# Patient Record
Sex: Female | Born: 1980 | Race: Black or African American | Hispanic: No | Marital: Single | Smoking: Never smoker
Health system: Southern US, Community
[De-identification: ages and names within clinical notes are randomized; demographics above are authoritative.]

## PROBLEM LIST (undated history)

## (undated) ENCOUNTER — Emergency Department (HOSPITAL_BASED_OUTPATIENT_CLINIC_OR_DEPARTMENT_OTHER): Payer: No Typology Code available for payment source

## (undated) ENCOUNTER — Inpatient Hospital Stay (HOSPITAL_COMMUNITY): Payer: Self-pay

## (undated) DIAGNOSIS — N390 Urinary tract infection, site not specified: Secondary | ICD-10-CM

## (undated) DIAGNOSIS — A749 Chlamydial infection, unspecified: Secondary | ICD-10-CM

## (undated) HISTORY — PX: NO PAST SURGERIES: SHX2092

---
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
## 2000-10-17 ENCOUNTER — Encounter: Payer: Self-pay | Admitting: Family Medicine

## 2000-10-17 ENCOUNTER — Ambulatory Visit (HOSPITAL_COMMUNITY): Admission: RE | Admit: 2000-10-17 | Discharge: 2000-10-17 | Payer: Self-pay | Admitting: Family Medicine

## 2001-03-18 HISTORY — PX: OTHER SURGICAL HISTORY: SHX169

## 2001-04-26 ENCOUNTER — Emergency Department (HOSPITAL_COMMUNITY): Admission: EM | Admit: 2001-04-26 | Discharge: 2001-04-26 | Payer: Self-pay | Admitting: Emergency Medicine

## 2001-05-04 ENCOUNTER — Encounter: Admission: RE | Admit: 2001-05-04 | Discharge: 2001-05-04 | Payer: Self-pay | Admitting: Family Medicine

## 2001-05-04 ENCOUNTER — Encounter: Payer: Self-pay | Admitting: Family Medicine

## 2003-09-06 ENCOUNTER — Emergency Department (HOSPITAL_COMMUNITY): Admission: EM | Admit: 2003-09-06 | Discharge: 2003-09-06 | Payer: Self-pay | Admitting: Emergency Medicine

## 2003-11-29 ENCOUNTER — Emergency Department (HOSPITAL_COMMUNITY): Admission: EM | Admit: 2003-11-29 | Discharge: 2003-11-29 | Payer: Self-pay | Admitting: Emergency Medicine

## 2004-06-12 ENCOUNTER — Emergency Department (HOSPITAL_COMMUNITY): Admission: EM | Admit: 2004-06-12 | Discharge: 2004-06-12 | Payer: Self-pay | Admitting: Emergency Medicine

## 2004-06-19 ENCOUNTER — Emergency Department (HOSPITAL_COMMUNITY): Admission: EM | Admit: 2004-06-19 | Discharge: 2004-06-19 | Payer: Self-pay | Admitting: Family Medicine

## 2004-08-17 ENCOUNTER — Ambulatory Visit: Payer: Self-pay | Admitting: Family Medicine

## 2004-08-20 ENCOUNTER — Ambulatory Visit: Payer: Self-pay | Admitting: *Deleted

## 2006-10-03 ENCOUNTER — Emergency Department (HOSPITAL_COMMUNITY): Admission: EM | Admit: 2006-10-03 | Discharge: 2006-10-04 | Payer: Self-pay | Admitting: Emergency Medicine

## 2007-07-13 ENCOUNTER — Emergency Department (HOSPITAL_COMMUNITY): Admission: EM | Admit: 2007-07-13 | Discharge: 2007-07-13 | Payer: Self-pay | Admitting: Family Medicine

## 2007-08-18 ENCOUNTER — Ambulatory Visit: Payer: Self-pay | Admitting: Nurse Practitioner

## 2007-08-18 DIAGNOSIS — B977 Papillomavirus as the cause of diseases classified elsewhere: Secondary | ICD-10-CM

## 2007-08-18 DIAGNOSIS — R3129 Other microscopic hematuria: Secondary | ICD-10-CM

## 2007-08-19 ENCOUNTER — Encounter (INDEPENDENT_AMBULATORY_CARE_PROVIDER_SITE_OTHER): Payer: Self-pay | Admitting: Nurse Practitioner

## 2007-08-21 ENCOUNTER — Encounter (INDEPENDENT_AMBULATORY_CARE_PROVIDER_SITE_OTHER): Payer: Self-pay | Admitting: Nurse Practitioner

## 2007-09-15 ENCOUNTER — Ambulatory Visit: Payer: Self-pay | Admitting: Internal Medicine

## 2007-09-15 LAB — CONVERTED CEMR LAB
Bilirubin Urine: NEGATIVE
Blood in Urine, dipstick: NEGATIVE
Nitrite: NEGATIVE
Protein, U semiquant: NEGATIVE
Specific Gravity, Urine: <1.005
Urobilinogen, UA: 0.2
WBC Urine, dipstick: NEGATIVE

## 2007-09-27 ENCOUNTER — Encounter (INDEPENDENT_AMBULATORY_CARE_PROVIDER_SITE_OTHER): Payer: Self-pay | Admitting: Nurse Practitioner

## 2007-09-27 ENCOUNTER — Ambulatory Visit: Payer: Self-pay | Admitting: Family Medicine

## 2007-09-27 DIAGNOSIS — R8789 Other abnormal findings in specimens from female genital organs: Secondary | ICD-10-CM | POA: Insufficient documentation

## 2007-09-27 LAB — CONVERTED CEMR LAB
Bilirubin Urine: NEGATIVE
Glucose, Urine, Semiquant: NEGATIVE
Protein, U semiquant: NEGATIVE
Specific Gravity, Urine: <1.005
pH: 7

## 2007-09-28 ENCOUNTER — Encounter (INDEPENDENT_AMBULATORY_CARE_PROVIDER_SITE_OTHER): Payer: Self-pay | Admitting: Nurse Practitioner

## 2007-10-02 ENCOUNTER — Telehealth (INDEPENDENT_AMBULATORY_CARE_PROVIDER_SITE_OTHER): Payer: Self-pay | Admitting: *Deleted

## 2007-10-20 ENCOUNTER — Ambulatory Visit: Payer: Self-pay | Admitting: Obstetrics & Gynecology

## 2007-10-20 ENCOUNTER — Other Ambulatory Visit: Admission: RE | Admit: 2007-10-20 | Discharge: 2007-10-20 | Payer: Self-pay | Admitting: Obstetrics & Gynecology

## 2007-10-20 ENCOUNTER — Encounter (INDEPENDENT_AMBULATORY_CARE_PROVIDER_SITE_OTHER): Payer: Self-pay | Admitting: Nurse Practitioner

## 2007-11-02 ENCOUNTER — Ambulatory Visit: Payer: Self-pay | Admitting: Obstetrics & Gynecology

## 2007-11-13 ENCOUNTER — Telehealth (INDEPENDENT_AMBULATORY_CARE_PROVIDER_SITE_OTHER): Payer: Self-pay | Admitting: Nurse Practitioner

## 2007-11-16 ENCOUNTER — Ambulatory Visit: Payer: Self-pay | Admitting: Nurse Practitioner

## 2007-11-16 DIAGNOSIS — N760 Acute vaginitis: Secondary | ICD-10-CM | POA: Insufficient documentation

## 2007-11-16 DIAGNOSIS — N898 Other specified noninflammatory disorders of vagina: Secondary | ICD-10-CM | POA: Insufficient documentation

## 2007-11-16 LAB — CONVERTED CEMR LAB
Glucose, Urine, Semiquant: NEGATIVE
Protein, U semiquant: NEGATIVE
WBC Urine, dipstick: NEGATIVE

## 2007-11-17 ENCOUNTER — Encounter (INDEPENDENT_AMBULATORY_CARE_PROVIDER_SITE_OTHER): Payer: Self-pay | Admitting: Nurse Practitioner

## 2007-11-17 LAB — CONVERTED CEMR LAB: GC Probe Amp, Urine: NEGATIVE

## 2007-12-29 ENCOUNTER — Telehealth (INDEPENDENT_AMBULATORY_CARE_PROVIDER_SITE_OTHER): Payer: Self-pay | Admitting: Nurse Practitioner

## 2008-01-03 ENCOUNTER — Ambulatory Visit: Payer: Self-pay | Admitting: Nurse Practitioner

## 2008-01-03 DIAGNOSIS — B373 Candidiasis of vulva and vagina: Secondary | ICD-10-CM | POA: Insufficient documentation

## 2008-01-03 LAB — CONVERTED CEMR LAB
Bilirubin Urine: NEGATIVE
Glucose, Urine, Semiquant: NEGATIVE
KOH Prep: NEGATIVE
Ketones, urine, test strip: NEGATIVE
Nitrite: NEGATIVE
Specific Gravity, Urine: 1.025
Urobilinogen, UA: 0.2
WBC Urine, dipstick: NEGATIVE

## 2008-01-04 ENCOUNTER — Encounter (INDEPENDENT_AMBULATORY_CARE_PROVIDER_SITE_OTHER): Payer: Self-pay | Admitting: Nurse Practitioner

## 2008-04-26 ENCOUNTER — Ambulatory Visit: Payer: Self-pay | Admitting: Nurse Practitioner

## 2008-04-26 ENCOUNTER — Encounter (INDEPENDENT_AMBULATORY_CARE_PROVIDER_SITE_OTHER): Payer: Self-pay | Admitting: Nurse Practitioner

## 2008-04-26 LAB — CONVERTED CEMR LAB
Bilirubin Urine: NEGATIVE
Protein, U semiquant: NEGATIVE
Urobilinogen, UA: 0.2
pH: 6

## 2008-05-03 ENCOUNTER — Encounter (INDEPENDENT_AMBULATORY_CARE_PROVIDER_SITE_OTHER): Payer: Self-pay | Admitting: Nurse Practitioner

## 2009-05-08 ENCOUNTER — Encounter (INDEPENDENT_AMBULATORY_CARE_PROVIDER_SITE_OTHER): Payer: Self-pay | Admitting: Nurse Practitioner

## 2009-05-26 ENCOUNTER — Ambulatory Visit: Payer: Self-pay | Admitting: Nurse Practitioner

## 2009-05-26 DIAGNOSIS — K5900 Constipation, unspecified: Secondary | ICD-10-CM | POA: Insufficient documentation

## 2009-05-26 LAB — CONVERTED CEMR LAB
ALT: <8 units/L (ref 0–35)
Albumin: 4.6 g/dL (ref 3.5–5.2)
CO2: 21 meq/L (ref 19–32)
Chloride: 106 meq/L (ref 96–112)
Glucose, Bld: 58 mg/dL — ABNORMAL LOW (ref 70–99)
HCT: 41.4 % (ref 36.0–46.0)
Hemoglobin: 13.2 g/dL (ref 12.0–15.0)
Lymphocytes Relative: 36 % (ref 12–46)
MCHC: 31.9 g/dL (ref 30.0–36.0)
MCV: 96.1 fL (ref 78.0–100.0)
Monocytes Relative: 5 % (ref 3–12)
Neutro Abs: 3.8 10*3/uL (ref 1.7–7.7)
Neutrophils Relative %: 57 % (ref 43–77)
Platelets: 239 10*3/uL (ref 150–400)
Potassium: 4 meq/L (ref 3.5–5.3)
Protein, U semiquant: NEGATIVE
RBC: 4.31 M/uL (ref 3.87–5.11)
RDW: 12.9 % (ref 11.5–15.5)
Sodium: 139 meq/L (ref 135–145)
Specific Gravity, Urine: 1.01
TSH: 2.18 microintl units/mL (ref 0.350–4.500)
Urobilinogen, UA: 0.2
WBC Urine, dipstick: NEGATIVE
pH: 6.5

## 2009-06-02 ENCOUNTER — Encounter (INDEPENDENT_AMBULATORY_CARE_PROVIDER_SITE_OTHER): Payer: Self-pay | Admitting: Nurse Practitioner

## 2009-06-02 LAB — CONVERTED CEMR LAB: Pap Smear: NEGATIVE

## 2009-06-13 ENCOUNTER — Telehealth (INDEPENDENT_AMBULATORY_CARE_PROVIDER_SITE_OTHER): Payer: Self-pay | Admitting: Nurse Practitioner

## 2009-06-19 ENCOUNTER — Encounter (INDEPENDENT_AMBULATORY_CARE_PROVIDER_SITE_OTHER): Payer: Self-pay | Admitting: *Deleted

## 2009-07-07 ENCOUNTER — Ambulatory Visit: Payer: Self-pay | Admitting: Nurse Practitioner

## 2009-07-07 ENCOUNTER — Inpatient Hospital Stay (HOSPITAL_COMMUNITY): Admission: AD | Admit: 2009-07-07 | Discharge: 2009-07-07 | Payer: Self-pay | Admitting: Obstetrics & Gynecology

## 2009-11-06 ENCOUNTER — Ambulatory Visit: Payer: Self-pay | Admitting: Nurse Practitioner

## 2009-11-06 LAB — CONVERTED CEMR LAB
Blood in Urine, dipstick: NEGATIVE
Ketones, urine, test strip: NEGATIVE
Protein, U semiquant: NEGATIVE
Specific Gravity, Urine: <1.005
Urobilinogen, UA: 0.2
WBC Urine, dipstick: NEGATIVE
pH: 6

## 2010-02-15 LAB — CONVERTED CEMR LAB
Albumin: 4.9 g/dL (ref 3.5–5.2)
Alkaline Phosphatase: 73 units/L (ref 39–117)
Basophils Relative: 0 % (ref 0–1)
Calcium: 10.1 mg/dL (ref 8.4–10.5)
Chloride: 102 meq/L (ref 96–112)
Cholesterol: 189 mg/dL (ref 0–200)
Eosinophils Absolute: 0.1 10*3/uL (ref 0.0–0.7)
Eosinophils Relative: 1 % (ref 0–5)
Glucose, Bld: 69 mg/dL — ABNORMAL LOW (ref 70–99)
HCT: 43.8 % (ref 36.0–46.0)
HDL: 75 mg/dL (ref >39)
Hemoglobin: 14.1 g/dL (ref 12.0–15.0)
Ketones, urine, test strip: NEGATIVE
Lymphs Abs: 2.1 10*3/uL (ref 0.7–4.0)
MCV: 94 fL (ref 78.0–100.0)
Monocytes Absolute: 0.4 10*3/uL (ref 0.1–1.0)
Monocytes Relative: 6 % (ref 3–12)
Neutrophils Relative %: 61 % (ref 43–77)
Protein, U semiquant: NEGATIVE
Sodium: 138 meq/L (ref 135–145)
Specific Gravity, Urine: <1.005
TSH: 1.057 microintl units/mL (ref 0.350–4.50)
Total CHOL/HDL Ratio: 2.5
Triglycerides: 57 mg/dL (ref <150)
Urobilinogen, UA: 0.2
VLDL: 11 mg/dL (ref 0–40)
WBC: 6.5 10*3/uL (ref 4.0–10.5)
pH: 5.5

## 2010-02-19 NOTE — Progress Notes (Signed)
Summary: Office Visit//DEPRESSION SCREENING  Office Visit//DEPRESSION SCREENING   Imported By: Arta Bruce 07/22/2009 15:25:11  _____________________________________________________________________  External Attachment:    Type:   Image     Comment:   External Document

## 2010-02-19 NOTE — Progress Notes (Signed)
Summary: BURNING AND DISCHARGE  Phone Note Call from Patient Call back at Home Phone [phone]   Summary of Call: Jaime Frost CALLED TO GET HER PAP AND LAB RESULTS. I TOLD HER THAT THE LETTER STATES THAT HER PAP AND LABS WERE OK.  SHE SAYS THAT SHE HAS SOME BURNING AND DISHCARGE. THE BURNING HAS BEEN GOING ON FOR A COUPLE OF WEEKS AND THE DISCHARGE JUST STARTED AND IT HAS AN ODOR AND THE SMELL IS AWFUL, AND W/THE DISCHARGE COMING OUT, IT FEELS LIKE HER CYCLE IS OFF. Initial call taken by: Leodis Rains,  Jun 13, 2009 12:29 PM  Follow-up for Phone Call        forward to N. Daphine Deutscher, FNP Follow-up by: Levon Hedger,  Jun 13, 2009 12:55 PM  Additional Follow-up for Phone Call Additional follow up Details #1::        pt can come for u/a and wet prep on next week however office is closed on monday. over the weekend advise pt to wipe from front to back, wear white cotton underwear, eat yogurt, drink water, no body gels or wash Additional Follow-up by: Lehman Prom FNP,  Jun 13, 2009 2:55 PM    Additional Follow-up for Phone Call Additional follow up Details #2::    called [phone] mail box is not set up could not leave a message. Levon Hedger  Jun 17, 2009 4:00 PM  Mailbox not set up -- unable to leave message.  Letter sent.  Dutch Quint RN  June 19, 2009 3:01 PM   Additional Follow-up for Phone Call Additional follow up Details #3:: Details for Additional Follow-up Action Taken: noted  Additional Follow-up by: Lehman Prom FNP,  June 19, 2009 3:07 PM

## 2010-02-19 NOTE — Letter (Signed)
Summary: *HSN Results Follow up  [facility]  [address] [place], [place] [address]   Phone: [phone]  Fax: [phone]      06/02/2009   ANALISE GLOTFELTY [address] [place], [place]  [address]   Dear  Ms. Deette Greeno,                            ____S.Drinkard,FNP   ____D. Gore,FNP       ____B. McPherson,MD   ____V. Rankins,MD    ____E. Mulberry,MD    _X___N. Daphine Deutscher, FNP  ____D. Reche Dixon, MD    ____K. Philipp Deputy, MD    ____Other     This letter is to inform you that your recent test(s):  ___X____Pap Smear    ___X____Lab Test     _______X-ray    ___X____ is within acceptable limits  _______ requires a medication change  _______ requires a follow-up lab visit  _______ requires a follow-up visit with your provider   Comments: Labs and pap smear done during recent office visit are normal.      _________________________________________________________ If you have any questions, please contact our office [phone].                    Sincerely,    Lehman Prom FNP [facility]

## 2010-02-19 NOTE — Letter (Signed)
Summary: Handout Printed  Printed Handout:  - Constipation in Adults

## 2010-02-19 NOTE — Assessment & Plan Note (Signed)
Summary: Reschedule  Pt is currently on Menses.  She was advised to reschedule CPE   Allergies: No Known Drug Allergies

## 2010-02-19 NOTE — Assessment & Plan Note (Signed)
Summary: Bacterial Vaginosis   Vital Signs:  Patient profile:   30 year old female Menstrual status:  regular LMP:     10/15/2009 Weight:      120.0 pounds BMI:     20.83 Temp:     97.9 degrees F oral Pulse rate:   70 / minute Pulse rhythm:   regular Resp:     12 per minute BP sitting:   110 / 60  (left arm) Cuff size:   regular  Vitals Entered By: Levon Hedger (November 06, 2009 12:25 PM) CC: vaginal discharge, Vaginal discharge Is Patient Diabetic? No Pain Assessment Patient in pain? no       Does patient need assistance? Functional Status Self care Ambulation Normal LMP (date): 10/15/2009 LMP - Character: light-heavy    Menses interval (days): 28 Enter LMP: 10/15/2009 Last PAP Result  Specimen Adequacy: Satisfactory for evaluation.   Interpretation/Result:Negative for intraepithelial Lesion or Malignancy.  Benign repairative changes present   CC:  vaginal discharge and Vaginal discharge.  History of Present Illness:  Vaginal discharge      This is a 30 year old woman who presents with Vaginal discharge.  The symptoms began 1 week ago.  The severity is described as moderate.  The patient complains of itching, but denies burning on urination, frequency, and urgency.  The discharge is described as white.  The patient denies the following symptoms: genital sores, unusual vaginal bleeding, and rash.    Allergies (verified): No Known Drug Allergies  Review of Systems CV:  Denies chest pain or discomfort. Resp:  Denies cough. GI:  Denies abdominal pain, nausea, and vomiting. GU:  +vaginal itching.  Physical Exam  General:  alert.   Head:  normocephalic.   Genitalia:  self wet prep Msk:  normal ROM.   Neurologic:  alert & oriented X3.     Impression & Recommendations:  Problem # 1:  BACTERIAL VAGINITIS (ICD-616.10)  Her updated medication list for this problem includes:    Metronidazole 500 Mg Tabs (Metronidazole) ..... One tablet by mouth two times a  day for infection  Orders: KOH/ WET Mount [phone]) UA Dipstick w/o Micro (manual) ([address])  Problem # 2:  NEED PROPHYLACTIC VACCINATION&INOCULATION FLU (ICD-V04.81) given today  Complete Medication List: 1)  Metronidazole 500 Mg Tabs (Metronidazole) .... One tablet by mouth two times a day for infection 2)  Fluconazole 150 Mg Tabs (Fluconazole) .... One tablet by mouth x 1 dose  Patient Instructions: 1)  You have received the flu vaccine today Prescriptions: FLUCONAZOLE 150 MG TABS (FLUCONAZOLE) One tablet by mouth x 1 dose  #1 x 0   Entered and Authorized by:   Lehman Prom FNP   Signed by:   Lehman Prom FNP on 11/06/2009   Method used:   Electronically to        [facility]  [phone]* (retail)       [address]       [place], [place]  [address]       Ph: [phone] or [phone]       Fax: [phone]   RxID:   [id number] METRONIDAZOLE 500 MG TABS (METRONIDAZOLE) One tablet by mouth two times a day for infection  #14 x 0   Entered and Authorized by:   Lehman Prom FNP   Signed by:   Lehman Prom FNP on 11/06/2009   Method used:   Electronically to  [facility]  [address]  [phone]* (retail)       [address]       [place], [place]  [address]       Ph: [phone] or [phone]       Fax: [phone]   RxID:   [id number]    Orders Added: 1)  Est. Patient Level III [99213] 2)  KOH/ WET Mount [87210] 3)  UA Dipstick w/o Micro (manual) [81002]    Laboratory Results   Urine Tests  Date/Time Received: November 06, 2009 12:51 PM   Routine Urinalysis   Color: lt. yellow Appearance: Clear Glucose: negative   (Normal Range: Negative) Bilirubin: negative   (Normal Range: Negative) Ketone: negative   (Normal Range: Negative) Spec. Gravity: <1.005   (Normal Range: 1.003-1.035) Blood: negative   (Normal Range: Negative) pH: 6.0   (Normal Range: 5.0-8.0) Protein: negative    (Normal Range: Negative) Urobilinogen: 0.2   (Normal Range: 0-1) Nitrite: negative   (Normal Range: Negative) Leukocyte Esterace: negative   (Normal Range: Negative)    Date/Time Received: November 06, 2009 1:34 PM   Wet Mount/KOH Source: vaginal WBC/hpf: 1-5 Bacteria/hpf: rare Clue cells/hpf: none Yeast/hpf: none Trichomonas/hpf: none   Laboratory Results   Urine Tests    Routine Urinalysis   Color: lt. yellow Appearance: Clear Glucose: negative   (Normal Range: Negative) Bilirubin: negative   (Normal Range: Negative) Ketone: negative   (Normal Range: Negative) Spec. Gravity: <1.005   (Normal Range: 1.003-1.035) Blood: negative   (Normal Range: Negative) pH: 6.0   (Normal Range: 5.0-8.0) Protein: negative   (Normal Range: Negative) Urobilinogen: 0.2   (Normal Range: 0-1) Nitrite: negative   (Normal Range: Negative) Leukocyte Esterace: negative   (Normal Range: Negative)      Wet Mount Wet Mount KOH: Negative    Prevention & Chronic Care Immunizations   Influenza vaccine: given in office today  (11/06/2009)    Tetanus booster: 08/18/2007: Tdap    Pneumococcal vaccine: Not documented  Other Screening   Pap smear:  Specimen Adequacy: Satisfactory for evaluation.   Interpretation/Result:Negative for intraepithelial Lesion or Malignancy.  Benign repairative changes present  (05/26/2009)   Pap smear action/deferral: Ordered  (05/26/2009)   Pap smear due: 05/2010   Smoking status: never  (05/26/2009)   Nursing Instructions: Give Flu vaccine today   Appended Document: Bacterial Vaginosis    Clinical Lists Changes  Orders: Added new Service order of Influenza Vaccine NON MCR (36644) - Signed Observations: Added new observation of FLU VAX EXP: 07/18/2010 (11/10/2009 14:17) Added new observation of FLU VAX#1VIS: 08/12/09 version given November 10, 2009. (11/10/2009 14:17) Added new observation of FLU VAXLOT: [id number] (11/10/2009 14:17) Added new  observation of FLU VAXBY: Chantel Miller (11/10/2009 14:17) Added new observation of FLU VAXRTE: IM (11/10/2009 14:17) Added new observation of FLU VAX DSE: 0.5 ml (11/10/2009 14:17) Added new observation of FLU VAXMFR: [employer] (11/10/2009 14:17) Added new observation of FLU VAX: Fluvax Non-MCR (11/10/2009 14:17)       Influenza Vaccine    Vaccine Type: Fluvax Non-MCR    Mfr: [employer]    Dose: 0.5 ml    Route: IM    Given by: Michelle Nasuti    Exp. Date: 07/18/2010    Lot #: [id number]    VIS given: 08/12/09 version given November 10, 2009.  Flu Vaccine Consent Questions    Do you have a history of severe allergic reactions to this vaccine? no  Any prior history of allergic reactions to egg and/or gelatin? no    Do you have a sensitivity to the preservative Thimersol? no    Do you have a past history of Guillan-Barre Syndrome? no    Do you currently have an acute febrile illness? no    Have you ever had a severe reaction to latex? no    Vaccine information given and explained to patient? yes    Are you currently pregnant? no

## 2010-02-19 NOTE — Assessment & Plan Note (Signed)
Summary: Complete Physical Exam   Vital Signs:  Patient profile:   30 year old female Menstrual status:  regular LMP:     05/19/2009 Height:      63.75 inches Weight:      122 pounds BMI:     21.18 Temp:     98.1 degrees F Pulse rate:   80 / minute Pulse rhythm:   regular Resp:     20 per minute BP sitting:   112 / 68  (left arm) Cuff size:   regular  Vitals Entered By: Vesta Mixer CMA (May 26, 2009 8:36 AM) CC: CPP Is Patient Diabetic? No Pain Assessment Patient in pain? yes     Location: vag Intensity: 5 Type: burning  Does patient need assistance? Ambulation Normal LMP (date): 05/19/2009 LMP - Character: light-heavy    Menses interval (days): 28 Menstrual Status regular Enter LMP: 05/19/2009 Last PAP Result ATYPICAL SQUAMOUS CELLS OF UNDETERMINED SIGNIFICANCE   CC:  CPP.  History of Present Illness:  Pt into the office for a complete physical exam  PAP - Hx of an abnormal PAP x 1 year (ASCUS) - she went to GYN for an colposcopy and was indicated to repeat in 1 year. 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 no     Have you felt little pleasure in things? no     Depression Counseling: not indicated; screening negative for depression     Drug Use: no     Seat Belt Use: 100     Sun Exposure: occasionally  Comments: PHQ-9 score = 9  Allergies (verified): No Known Drug Allergies  Review of Systems General:  Denies fever. Eyes:  Denies  blurring. ENT:  Denies earache. CV:  Denies chest pain or discomfort. Resp:  Denies cough. GI:  Complains of constipation; denies abdominal pain, nausea, and vomiting; admits to straining during bowel movements. GU:  Complains of discharge. MS:  Denies joint pain. Derm:  Denies rash. Neuro:  Denies headaches. Psych:  Denies anxiety and depression.  Physical Exam  General:  alert.   Head:  normocephalic.   Eyes:  pupils equal and pupils round.   Ears:  bil TM with bony landmarks present no erythema Nose:  no nasal discharge.   Mouth:  discoloration Neck:  supple.   Chest Wall:  no mass.   Breasts:  skin/areolae normal and no masses.   Lungs:  normal breath sounds.   Heart:  normal rate and regular rhythm.   Abdomen:  soft, non-tender, and normal bowel sounds.   Rectal:  no external abnormalities.   Msk:  up to the exam table Pulses:  R radial normal and L radial normal.   Extremities:  no edema Neurologic:  alert & oriented X3.   Skin:  color normal.   Psych:  Oriented X3.    Pelvic Exam  Vulva:  normal appearance.   Urethra and Bladder:      Urethra--no discharge.   Vagina:      physiologic discharge.   Cervix:      midposition, everted transformation zone.   Uterus:      smooth.   Adnexa:      nontender bilaterally.      Impression & Recommendations:  Problem # 1:  ROUTINE GYNECOLOGICAL EXAMINATION (ICD-V72.31) labs done except lipids as pt is not fasting PHQ-9 score = 9 rec optho and dental exam PAP done Orders: KOH/ WET Mount [id number]) Pap Smear, Thin Prep ( Collection of) [id number]) T- GC Chlamydia (24401) UA Dipstick w/o Micro (manual) (02725) T-TSH (36644-03474) T-Syphilis Test (RPR) (25956-38756) Rapid HIV  (43329) T-CBC w/Diff (51884-16606) T-Comprehensive Metabolic Panel (30160-10932)  Problem # 2:  CONSTIPATION (ICD-564.00) reviewed dx with pt needs to add more fiber to diet handout given and fiber supplement  Patient  Instructions: 1)  You will be notified of any abnormal lab results. 2)  All labs checked except cholesterol since you had already eaten 3)  You need to add more fiber foods to your diet. 4)  You should walk about 15 minutes per day - non-stop 5)  drink plenty of water 6)  eat veges (read handout on high fiber foods) 7)  Follow up as needed  Laboratory Results   Urine Tests  Date/Time Received: May 26, 2009   Routine Urinalysis   Glucose: negative   (Normal Range: Negative) Bilirubin: negative   (Normal Range: Negative) Ketone: negative   (Normal Range: Negative) Spec. Gravity: 1.010   (Normal Range: 1.003-1.035) Blood: negative   (Normal Range: Negative) pH: 6.5   (Normal Range: 5.0-8.0) Protein: negative   (Normal Range: Negative) Urobilinogen: 0.2   (Normal Range: 0-1) Nitrite: negative   (Normal Range: Negative) Leukocyte Esterace: negative   (Normal Range: Negative)    Date/Time Received: May 26, 2009   Wet Mount Source: vaginal WBC/hpf: 5-10 Bacteria/hpf: 1+ Clue cells/hpf: none Yeast/hpf: few Wet Mount KOH: Negative Trichomonas/hpf: none  Other Tests  Rapid HIV: negative Comments: wet prep difficult to view with no covers for slide - will await PAP results for definative dx    Prevention & Chronic Care Immunizations   Influenza vaccine: Not documented    Tetanus booster: 08/18/2007: Tdap    Pneumococcal vaccine: Not documented  Other Screening   Pap smear: ATYPICAL SQUAMOUS CELLS OF UNDETERMINED SIGNIFICANCE  (04/26/2008)   Pap smear action/deferral: Ordered  (05/26/2009)   Pap smear due: 05/27/2010   Smoking status: never  (05/26/2009)

## 2010-02-19 NOTE — Letter (Signed)
Summary: Generic Letter  [facility]  [address] [place], [place] [address]   Phone: [phone]  Fax: [phone]    06/19/2009  VINCENZA DAIL [address] [place], [place]  [address]  Dear Ms. Stapp,  We have been unable to contact you by telephone.  Please call our office at your earliest convenience, so that we may speak with you.   Sincerely,   Dutch Quint RN

## 2010-02-19 NOTE — Letter (Signed)
Summary: Handout Printed  Printed Handout:  - Diet - High-Fiber 

## 2010-04-05 LAB — HERPES SIMPLEX VIRUS CULTURE: Culture: NOT DETECTED

## 2010-04-05 LAB — WET PREP, GENITAL
Trich, Wet Prep: NONE SEEN
Yeast Wet Prep HPF POC: NONE SEEN

## 2010-04-05 LAB — URINALYSIS, ROUTINE W REFLEX MICROSCOPIC
Bilirubin Urine: NEGATIVE
Glucose, UA: NEGATIVE mg/dL
Urobilinogen, UA: 0.2 mg/dL (ref 0.0–1.0)
pH: 5.5 (ref 5.0–8.0)

## 2010-04-05 LAB — GC/CHLAMYDIA PROBE AMP, GENITAL: Chlamydia, DNA Probe: NEGATIVE

## 2010-06-18 ENCOUNTER — Inpatient Hospital Stay (INDEPENDENT_AMBULATORY_CARE_PROVIDER_SITE_OTHER)
Admission: RE | Admit: 2010-06-18 | Discharge: 2010-06-18 | Disposition: A | Discharge status: Home / Self Care | Payer: Self-pay | Source: Ambulatory Visit | Attending: Emergency Medicine | Admitting: Emergency Medicine

## 2010-06-18 DIAGNOSIS — N760 Acute vaginitis: Secondary | ICD-10-CM

## 2010-06-18 DIAGNOSIS — B373 Candidiasis of vulva and vagina: Secondary | ICD-10-CM

## 2010-06-18 DIAGNOSIS — A499 Bacterial infection, unspecified: Secondary | ICD-10-CM

## 2010-06-18 LAB — WET PREP, GENITAL: Yeast Wet Prep HPF POC: NONE SEEN

## 2010-06-18 LAB — POCT URINALYSIS DIP (DEVICE)
Glucose, UA: NEGATIVE mg/dL
Nitrite: NEGATIVE
Specific Gravity, Urine: 1.01 (ref 1.005–1.030)
Urobilinogen, UA: 0.2 mg/dL (ref 0.0–1.0)

## 2010-06-18 LAB — POCT PREGNANCY, URINE: Preg Test, Ur: NEGATIVE

## 2010-10-15 LAB — POCT URINALYSIS DIP (DEVICE)
Bilirubin Urine: NEGATIVE
Glucose, UA: NEGATIVE
Ketones, ur: NEGATIVE
Operator id: 239701

## 2010-10-15 LAB — POCT PREGNANCY, URINE: Operator id: 247071

## 2010-10-20 LAB — POCT PREGNANCY, URINE: Preg Test, Ur: NEGATIVE

## 2010-10-29 LAB — URINALYSIS, ROUTINE W REFLEX MICROSCOPIC
Nitrite: NEGATIVE
Protein, ur: NEGATIVE
Specific Gravity, Urine: 1.024
Urobilinogen, UA: 1

## 2010-10-29 LAB — I-STAT 8, (EC8 V) (CONVERTED LAB)
BUN: 9
Bicarbonate: 26.5 — ABNORMAL HIGH
Chloride: 108
Glucose, Bld: 150 — ABNORMAL HIGH
HCT: 42
Hemoglobin: 14.3
Operator id: 277751
Potassium: 4.7
Sodium: 141
TCO2: 28
pCO2, Ven: 49
pH, Ven: 7.342 — ABNORMAL HIGH

## 2010-10-29 LAB — POCT I-STAT CREATININE
Creatinine, Ser: 0.8
Operator id: 277751

## 2010-10-29 LAB — RPR: RPR Ser Ql: NONREACTIVE

## 2010-10-29 LAB — POCT PREGNANCY, URINE
Operator id: 277751
Preg Test, Ur: NEGATIVE

## 2015-10-05 ENCOUNTER — Emergency Department (HOSPITAL_COMMUNITY)
Admission: EM | Admit: 2015-10-05 | Discharge: 2015-10-05 | Disposition: A | Discharge status: Home / Self Care | Payer: BLUE CROSS/BLUE SHIELD | Attending: Emergency Medicine | Admitting: Emergency Medicine

## 2015-10-05 ENCOUNTER — Encounter (HOSPITAL_COMMUNITY): Payer: Self-pay

## 2015-10-05 DIAGNOSIS — Z79899 Other long term (current) drug therapy: Secondary | ICD-10-CM | POA: Diagnosis not present

## 2015-10-05 DIAGNOSIS — N760 Acute vaginitis: Secondary | ICD-10-CM

## 2015-10-05 DIAGNOSIS — H109 Unspecified conjunctivitis: Secondary | ICD-10-CM | POA: Insufficient documentation

## 2015-10-05 DIAGNOSIS — R300 Dysuria: Secondary | ICD-10-CM | POA: Diagnosis present

## 2015-10-05 LAB — URINALYSIS, ROUTINE W REFLEX MICROSCOPIC
Bilirubin Urine: NEGATIVE
GLUCOSE, UA: NEGATIVE mg/dL
Hgb urine dipstick: NEGATIVE
KETONES UR: NEGATIVE mg/dL
NITRITE: NEGATIVE
PH: 6.5 (ref 5.0–8.0)
Protein, ur: NEGATIVE mg/dL
SPECIFIC GRAVITY, URINE: 1.009 (ref 1.005–1.030)

## 2015-10-05 LAB — WET PREP, GENITAL
Clue Cells Wet Prep HPF POC: NONE SEEN
SPERM: NONE SEEN
TRICH WET PREP: NONE SEEN
YEAST WET PREP: NONE SEEN

## 2015-10-05 LAB — URINE MICROSCOPIC-ADD ON
BACTERIA UA: NONE SEEN
RBC / HPF: NONE SEEN RBC/hpf (ref 0–5)

## 2015-10-05 LAB — PREGNANCY, URINE: PREG TEST UR: NEGATIVE

## 2015-10-05 MED ORDER — METRONIDAZOLE 500 MG PO TABS: 500.0000 mg | ORAL_TABLET | Freq: Two times a day (BID) | ORAL | 0 refills | Status: DC

## 2015-10-05 MED ORDER — TETRACAINE HCL 0.5 % OP SOLN
2.0000 [drp] | Freq: Once | OPHTHALMIC | Status: AC
  Administered 2015-10-05: 2 [drp] via OPHTHALMIC

## 2015-10-05 MED ORDER — POLYMYXIN B-TRIMETHOPRIM 10000-0.1 UNIT/ML-% OP SOLN: 2.0000 [drp] | OPHTHALMIC | 0 refills | Status: DC

## 2015-10-05 NOTE — ED Triage Notes (Signed)
Pt with right red eye.   Some discharge in eye.  No injury. Started on Friday. Also with urinary discomfort and pain since Thursday. No fever.

## 2015-10-05 NOTE — ED Provider Notes (Signed)
[facility]-EMERGENCY DEPT Provider Note   CSN: [id number] Arrival date & time: 10/05/15  1208     History   Chief Complaint Chief Complaint  Patient presents with  . Eye Pain  . Dysuria    HPI Jaime Frost is a 35 y.o. female.  The history is provided by the patient and medical records. No language interpreter was used.  Eye Pain  Pertinent negatives include no chest pain, no abdominal pain and no shortness of breath.  Dysuria   Pertinent negatives include no nausea and no vomiting.   Jaime Frost is a 35 y.o. female  who presents to the Emergency Department with two complaints:   Worsening right eye itching and redness x 2 days. Discharge began today. Denies foreign body sensation or something in the eye. 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 Endorses associated nasal congestion, sneezing. No visual changes, fevers, cough, ear pain, sore throat. No medications or treatments prior to arrival for symptoms.   Also complaining of vaginal "irritation" and itching x 3-4 days associated with intermittent brown discharge and dysuria. LMP 9/05. Patient is not sexually active. Monospot used for white vaginal discharge a few weeks ago which resolved. Denies vaginal bleeding, abdominal pain, n/v/d.     History reviewed. No pertinent past medical history.  Patient Active Problem List   Diagnosis Date Noted  . CONSTIPATION 05/26/2009  . CANDIDIASIS OF VULVA AND VAGINA 01/03/2008  . BACTERIAL VAGINITIS 11/16/2007  . VAGINAL DISCHARGE 11/16/2007  . PAP SMEAR, LGSIL, ABNORMAL 09/27/2007  . 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 MICROSCOPIC HEMATURIA 08/18/2007    History reviewed. No pertinent surgical history.  OB History    No data available       Home Medications    Prior to Admission medications   Medication Sig Start Date End Date Taking? Authorizing Provider  ibuprofen (ADVIL,MOTRIN) 200 MG tablet Take 200 mg by mouth every 6 (six) hours as needed for mild pain.   Yes  Historical Provider, MD  metroNIDAZOLE (FLAGYL) 500 MG tablet Take 1 tablet (500 mg total) by mouth 2 (two) times daily. 10/05/15   Chase Picket Ettore Trebilcock, PA-C  trimethoprim-polymyxin b (POLYTRIM) ophthalmic solution Place 2 drops into the right eye every 4 (four) hours. 10/05/15   Chase Picket Shaneal Barasch, PA-C    Family History History reviewed. No pertinent family history.  Social History Social History  Substance Use Topics  . Smoking status: Never Smoker  . Smokeless tobacco: Never Used  . Alcohol use Yes     Comment: social     Allergies   Review of patient's allergies indicates no known allergies.   Review of Systems Review of Systems  Constitutional: Negative for fever.  HENT: Positive for congestion.   Eyes: Positive for pain, discharge and redness. Negative for visual disturbance.  Respiratory: Negative for cough and shortness of breath.   Cardiovascular: Negative for chest pain.  Gastrointestinal: Negative for abdominal pain, nausea and vomiting.  Genitourinary: Positive for dysuria and vaginal discharge. Negative for vaginal bleeding.  Musculoskeletal: Negative for back pain.  Skin: Negative for rash.  Allergic/Immunologic: Negative for immunocompromised state.     Physical Exam Updated Vital Signs BP 106/61   Pulse 82   Temp 98.1 F (36.7 C) (Oral)   Resp 17   LMP 09/23/2015   SpO2 100%   Physical Exam  Constitutional: She is oriented to person, place, and time. She appears well-developed and well-nourished. No distress.  HENT:  Head: Normocephalic and atraumatic.  Eyes: EOM and lids are normal. Pupils are equal, round, and reactive to light. Lids are everted and swept, no foreign bodies found. Right conjunctiva is injected. Left conjunctiva is not injected.  Tetracaine, fluoroscein and wood's lamp with no evidence of injury / defect to the right eye. Normal pupillary exam and normal EOM.  No consensual pain.   Cardiovascular: Normal rate, regular rhythm, normal  heart sounds and intact distal pulses.  Exam reveals no gallop and no friction rub.   No murmur heard. Pulmonary/Chest: Effort normal and breath sounds normal. No respiratory distress. She has no wheezes. She has no rales. She exhibits no tenderness.  Abdominal: Soft. Bowel sounds are normal. She exhibits no distension. There is no tenderness.  Genitourinary:  Genitourinary Comments: Chaperone present for exam. + white discharge. No rashes, lesions, or tenderness to external genitalia. No erythema, injury, or tenderness to vaginal mucosa. No bleeding within vaginal vault. No adnexal masses, tenderness, or fullness. No CMT.  Musculoskeletal: She exhibits no edema.  Neurological: She is alert and oriented to person, place, and time.  Skin: Skin is warm and dry.  Nursing note and vitals reviewed.    ED Treatments / Results  Labs (all labs ordered are listed, but only abnormal results are displayed) Labs Reviewed  WET PREP, GENITAL - Abnormal; Notable for the following:       Result Value   WBC, Wet Prep HPF POC MANY (*)    All other components within normal limits  URINALYSIS, ROUTINE W REFLEX MICROSCOPIC (NOT AT [facility]) - Abnormal; Notable for the following:    Leukocytes, UA TRACE (*)    All other components within normal limits  URINE MICROSCOPIC-ADD ON - Abnormal; Notable for the following:    Squamous Epithelial / LPF 0-5 (*)    All other components within normal limits  PREGNANCY, URINE  GC/CHLAMYDIA PROBE AMP ([place]) NOT AT [facility]    EKG  EKG Interpretation None       Radiology No results found.  Procedures Procedures (including critical care time)  Medications Ordered in ED Medications  tetracaine (PONTOCAINE) 0.5 % ophthalmic solution 2 drop (2 drops Right Eye Given 10/05/15 1309)     Initial Impression / Assessment and Plan / ED Course  I have reviewed the triage vital signs and the nursing notes.  Pertinent labs & imaging results that were available  during my care of the patient were reviewed by me and considered in my medical decision making (see chart for details).  Clinical Course   Jaime Frost is a 35 y.o. female who presents to ED with two complaints:  1. Right eye redness, itching and discharge c/w conjunctivitis on exam. Eye was stained with no evidence of corneal abrasion. Will treat with polytrim and PCP follow up if symptoms do not improve. Hygiene and home care instructions discussed.   2. Vaginal irritation and discharge with white discharge noted on exam. Afebrile with benign abdominal exam. No adnexal or cervical motion tenderness. Not sexually active. UA with no signs of infection. Wet prep with many white cells but otherwise unremarkable. Will treat with flagyl. G&C obtained and patient informed that she will be notified if results are positive but given that she states she is not sexually active, unlikely cause of symptoms. OBGYN follow up if symptoms persist.   Reasons to return to ER discussed and all questions answered.    Patient discussed with Dr. Radford Pax who agrees with treatment plan.    Final Clinical Impressions(s) /  ED Diagnoses   Final diagnoses:  Vaginitis  Conjunctivitis of right eye    New Prescriptions Discharge Medication List as of 10/05/2015  3:19 PM    START taking these medications   Details  metroNIDAZOLE (FLAGYL) 500 MG tablet Take 1 tablet (500 mg total) by mouth 2 (two) times daily., Starting Sun 10/05/2015, Print    trimethoprim-polymyxin b (POLYTRIM) ophthalmic solution Place 2 drops into the right eye every 4 (four) hours., Starting Sun 10/05/2015, Print         [facility] County HospitalJaime Pilcher Nancy Arvin, PA-C 10/05/15 1711    Nelva Nayobert Beaton, MD 10/12/15 [phone]

## 2015-10-05 NOTE — ED Notes (Signed)
Patient c/o lower abdominal cramping, pain with urination and Jaime Frost vaginal discharge that is malodorous.  Patient has a hx of BV.  Patient denies N/V/D and fever.  Patient also c/o right eye redness and clear drainage x2 days.  Patient also c/o nasal drainage, coughing and sneezing x1 week.  No hx of seasonal allergies.

## 2015-10-05 NOTE — Discharge Instructions (Signed)
Take antibiotics as directed.  Follow up with OBGYN if vaginal symptoms do not improve. Follow up with primary physician for recheck of eye if symptoms do not improve after one week of antibiotic drops.  Return to ER for new or worsening symptoms, any additional concerns.

## 2015-10-06 LAB — GC/CHLAMYDIA PROBE AMP (~~LOC~~) NOT AT ARMC
Chlamydia: NEGATIVE
Neisseria Gonorrhea: NEGATIVE

## 2016-04-29 ENCOUNTER — Encounter (HOSPITAL_COMMUNITY): Payer: Self-pay | Admitting: Emergency Medicine

## 2016-04-29 ENCOUNTER — Emergency Department (HOSPITAL_COMMUNITY)
Admission: EM | Admit: 2016-04-29 | Discharge: 2016-04-29 | Disposition: A | Discharge status: Home / Self Care | Payer: BLUE CROSS/BLUE SHIELD | Attending: Dermatology | Admitting: Dermatology

## 2016-04-29 DIAGNOSIS — N898 Other specified noninflammatory disorders of vagina: Secondary | ICD-10-CM | POA: Diagnosis present

## 2016-04-29 DIAGNOSIS — Z5321 Procedure and treatment not carried out due to patient leaving prior to being seen by health care provider: Secondary | ICD-10-CM | POA: Diagnosis not present

## 2016-04-29 NOTE — ED Triage Notes (Signed)
Pt c/o vaginal irritation, burning. No discharge or bleeding, no abdominal pain. Pt recently resumed sexual intercourse with her boyfriend, both were diagnosed with STI that "starts with an 'A,'" took unknown azithromycin which helped initially but now irritation and burning x 1 week.

## 2016-04-29 NOTE — ED Notes (Signed)
Pt called for room no response from lobby 

## 2016-04-29 NOTE — ED Notes (Signed)
Called patient and no answer.

## 2016-05-01 ENCOUNTER — Encounter (HOSPITAL_COMMUNITY): Payer: Self-pay

## 2016-05-01 ENCOUNTER — Emergency Department (HOSPITAL_COMMUNITY)
Admission: EM | Admit: 2016-05-01 | Discharge: 2016-05-01 | Disposition: A | Discharge status: Home / Self Care | Payer: BLUE CROSS/BLUE SHIELD | Attending: Emergency Medicine | Admitting: Emergency Medicine

## 2016-05-01 DIAGNOSIS — B3731 Acute candidiasis of vulva and vagina: Secondary | ICD-10-CM

## 2016-05-01 DIAGNOSIS — B379 Candidiasis, unspecified: Secondary | ICD-10-CM | POA: Diagnosis not present

## 2016-05-01 DIAGNOSIS — Z79899 Other long term (current) drug therapy: Secondary | ICD-10-CM | POA: Insufficient documentation

## 2016-05-01 DIAGNOSIS — N760 Acute vaginitis: Secondary | ICD-10-CM | POA: Diagnosis not present

## 2016-05-01 DIAGNOSIS — B9689 Other specified bacterial agents as the cause of diseases classified elsewhere: Secondary | ICD-10-CM | POA: Diagnosis not present

## 2016-05-01 DIAGNOSIS — B373 Candidiasis of vulva and vagina: Secondary | ICD-10-CM

## 2016-05-01 DIAGNOSIS — N898 Other specified noninflammatory disorders of vagina: Secondary | ICD-10-CM | POA: Diagnosis present

## 2016-05-01 LAB — WET PREP, GENITAL
SPERM: NONE SEEN
TRICH WET PREP: NONE SEEN
YEAST WET PREP: NONE SEEN

## 2016-05-01 LAB — URINALYSIS, ROUTINE W REFLEX MICROSCOPIC
Bilirubin Urine: NEGATIVE
Glucose, UA: NEGATIVE mg/dL
Hgb urine dipstick: NEGATIVE
Ketones, ur: NEGATIVE mg/dL
Leukocytes, UA: NEGATIVE
NITRITE: NEGATIVE
PH: 6 (ref 5.0–8.0)
Protein, ur: NEGATIVE mg/dL
SPECIFIC GRAVITY, URINE: 1.02 (ref 1.005–1.030)

## 2016-05-01 LAB — POC URINE PREG, ED: Preg Test, Ur: NEGATIVE

## 2016-05-01 MED ORDER — METRONIDAZOLE 500 MG PO TABS: 500.0000 mg | ORAL_TABLET | Freq: Two times a day (BID) | ORAL | 0 refills | Status: DC

## 2016-05-01 MED ORDER — FLUCONAZOLE 150 MG PO TABS
150.0000 mg | ORAL_TABLET | Freq: Once | ORAL | Status: AC
  Administered 2016-05-01: 150 mg via ORAL
  Filled 2016-05-01: qty 1

## 2016-05-01 MED ORDER — FLUCONAZOLE 150 MG PO TABS: 150.0000 mg | ORAL_TABLET | Freq: Once | ORAL | 0 refills | Status: AC

## 2016-05-01 NOTE — ED Triage Notes (Signed)
Pt states that she has been experiencing vaginal burning and itching over the last week. Pt reports trying monistat without relief. Reports a slight amount of white discharge. A&Ox4. Ambulatory. Pt was seen 2 days ago for same.

## 2016-05-01 NOTE — ED Provider Notes (Signed)
[facility]-EMERGENCY DEPT Provider Note   CSN: [id number] Arrival date & time: 05/01/16  1518     History   Chief Complaint Chief Complaint  Patient presents with  . Vaginal Itching    HPI Jaime Frost is a 36 y.o. female.   Vaginal Itching  This is a recurrent problem. The current episode started 2 days ago. The problem occurs constantly. The problem has not changed since onset.Pertinent negatives include no chest pain. Nothing aggravates the symptoms. Nothing relieves the symptoms. She has tried nothing for the symptoms. The treatment provided no relief.    History reviewed. No pertinent past medical history.  Patient Active Problem List   Diagnosis Date Noted  . CONSTIPATION 05/26/2009  . CANDIDIASIS OF VULVA AND VAGINA 01/03/2008  . BACTERIAL VAGINITIS 11/16/2007  . VAGINAL DISCHARGE 11/16/2007  . PAP SMEAR, LGSIL, ABNORMAL 09/27/2007  . 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 MICROSCOPIC HEMATURIA 08/18/2007    History reviewed. No pertinent surgical history.  OB History    No data available       Home Medications    Prior to Admission medications   Medication Sig Start Date End Date Taking? Authorizing Provider  fluconazole (DIFLUCAN) 150 MG tablet Take 1 tablet (150 mg total) by mouth once. In three days if not improving. 05/01/16 05/01/16  Marily Memos, MD  ibuprofen (ADVIL,MOTRIN) 200 MG tablet Take 200 mg by mouth every 6 (six) hours as needed for mild pain.    Historical Provider, MD  metroNIDAZOLE (FLAGYL) 500 MG tablet Take 1 tablet (500 mg total) by mouth 2 (two) times daily. One po bid x 7 days 05/01/16   Marily Memos, MD  trimethoprim-polymyxin b (POLYTRIM) ophthalmic solution Place 2 drops into the right eye every 4 (four) hours. 10/05/15   Chase Picket Ward, PA-C    Family History History reviewed. No pertinent family history.  Social History Social History  Substance Use Topics  . Smoking status: Never Smoker  . Smokeless tobacco: Never Used  . Alcohol use Yes   Comment: social     Allergies   Patient has no known allergies.   Review of Systems Review of Systems  Cardiovascular: Negative for chest pain.  All other systems reviewed and are negative.    Physical Exam Updated Vital Signs BP 120/74 (BP Location: Right Arm)   Pulse 71   Temp 99.1 F (37.3 C) (Oral)   Resp 14   Ht  (1.549 m)   Wt 112 lb 5 oz (50.9 kg)   SpO2 100%   BMI 21.22 kg/m   Physical Exam  Constitutional: She is oriented to person, place, and time. She appears well-developed and well-nourished.  HENT:  Head: Normocephalic and atraumatic.  Eyes: Conjunctivae and EOM are normal.  Neck: Normal range of motion.  Cardiovascular: Normal rate and regular rhythm.   Pulmonary/Chest: Effort normal. No stridor. No respiratory distress.  Abdominal: Soft. She exhibits no distension.  Genitourinary: Vaginal discharge found.  Musculoskeletal: Normal range of motion. She exhibits no edema or deformity.  Neurological: She is alert and oriented to person, place, and time. No cranial nerve deficit. Coordination normal.  Skin: Skin is warm and dry.  Nursing note and vitals reviewed.    ED Treatments / Results  Labs (all labs ordered are listed, but only abnormal results are displayed) Labs Reviewed  WET PREP, GENITAL - Abnormal; Notable for the following:       Result Value   Clue Cells Wet Prep HPF POC PRESENT (*)  WBC, Wet Prep HPF POC FEW (*)    All other components within normal limits  URINALYSIS, ROUTINE W REFLEX MICROSCOPIC  POC URINE PREG, ED  POC URINE PREG, ED  GC/CHLAMYDIA PROBE AMP ([place]) NOT AT [facility]    EKG  EKG Interpretation None       Radiology No results found.  Procedures Procedures (including critical care time)  Medications Ordered in ED Medications  fluconazole (DIFLUCAN) tablet 150 mg (150 mg Oral Given 05/01/16 1924)     Initial Impression / Assessment and Plan / ED Course  I have reviewed the triage vital signs  and the nursing notes.  Pertinent labs & imaging results that were available during my care of the patient were reviewed by me and considered in my medical decision making (see chart for details).    Likely yeast infectiion. Itching/chunky discharge after being on abx a week ago.  Will check for same, treat empirically.   BV on wet prep, no yeast but still sounds like a yeast infection and already treated. Will start flagyl.   Final Clinical Impressions(s) / ED Diagnoses   Final diagnoses:  BV (bacterial vaginosis)  Yeast vaginitis    New Prescriptions Discharge Medication List as of 05/01/2016  7:57 PM    START taking these medications   Details  fluconazole (DIFLUCAN) 150 MG tablet Take 1 tablet (150 mg total) by mouth once. In three days if not improving., Starting Sat 05/01/2016, Print         Marily Memos, MD 05/01/16 2031

## 2016-05-03 LAB — GC/CHLAMYDIA PROBE AMP (~~LOC~~) NOT AT ARMC
Chlamydia: NEGATIVE
Neisseria Gonorrhea: NEGATIVE

## 2016-05-15 ENCOUNTER — Ambulatory Visit (HOSPITAL_COMMUNITY)
Admission: EM | Admit: 2016-05-15 | Discharge: 2016-05-15 | Disposition: A | Discharge status: Home / Self Care | Payer: BLUE CROSS/BLUE SHIELD | Attending: Internal Medicine | Admitting: Internal Medicine

## 2016-05-15 ENCOUNTER — Encounter (HOSPITAL_COMMUNITY): Payer: Self-pay | Admitting: Emergency Medicine

## 2016-05-15 DIAGNOSIS — N760 Acute vaginitis: Secondary | ICD-10-CM

## 2016-05-15 DIAGNOSIS — B9689 Other specified bacterial agents as the cause of diseases classified elsewhere: Secondary | ICD-10-CM

## 2016-05-15 DIAGNOSIS — N898 Other specified noninflammatory disorders of vagina: Secondary | ICD-10-CM | POA: Diagnosis not present

## 2016-05-15 DIAGNOSIS — Z3202 Encounter for pregnancy test, result negative: Secondary | ICD-10-CM

## 2016-05-15 LAB — POCT URINALYSIS DIP (DEVICE)
Bilirubin Urine: NEGATIVE
Glucose, UA: NEGATIVE mg/dL
Ketones, ur: NEGATIVE mg/dL
LEUKOCYTES UA: NEGATIVE
NITRITE: NEGATIVE
PH: 6 (ref 5.0–8.0)
Protein, ur: NEGATIVE mg/dL
Specific Gravity, Urine: <=1.005 (ref 1.005–1.030)
Urobilinogen, UA: 0.2 mg/dL (ref 0.0–1.0)

## 2016-05-15 LAB — POCT PREGNANCY, URINE: PREG TEST UR: NEGATIVE

## 2016-05-15 MED ORDER — FAMOTIDINE 20 MG PO TABS
ORAL_TABLET | ORAL | Status: AC
  Filled 2016-05-15: qty 1

## 2016-05-15 MED ORDER — ONDANSETRON 4 MG PO TBDP
8.0000 mg | ORAL_TABLET | Freq: Once | ORAL | Status: AC
  Administered 2016-05-15: 8 mg via ORAL

## 2016-05-15 MED ORDER — FLUCONAZOLE 200 MG PO TABS: ORAL_TABLET | ORAL | 0 refills | Status: DC

## 2016-05-15 MED ORDER — METRONIDAZOLE 500 MG PO TABS
2000.0000 mg | ORAL_TABLET | Freq: Once | ORAL | Status: AC
  Administered 2016-05-15: 2000 mg via ORAL

## 2016-05-15 MED ORDER — ONDANSETRON 4 MG PO TBDP
ORAL_TABLET | ORAL | Status: AC
  Filled 2016-05-15: qty 2

## 2016-05-15 MED ORDER — METRONIDAZOLE 500 MG PO TABS
ORAL_TABLET | ORAL | Status: AC
  Filled 2016-05-15: qty 4

## 2016-05-15 MED ORDER — FAMOTIDINE 20 MG PO TABS
20.0000 mg | ORAL_TABLET | Freq: Once | ORAL | Status: AC
  Administered 2016-05-15: 20 mg via ORAL

## 2016-05-15 NOTE — Discharge Instructions (Signed)
You're being retested for bacterial vaginosis, and yeast. You been given a higher dose of metronidazole then you received 2 weeks ago. This will make your stomach irritated, I would recommend eating as soon as possible. If your positive for anything that was not treated today, you'll be notified in 3-5 business days.

## 2016-05-15 NOTE — ED Notes (Signed)
Call back number verified and updated in EPIC... Adv pt to not have SI until lab results comeback neg.... Also adv pt lab results will be on MyChart; instructions given .... Pt verb understanding.   

## 2016-05-15 NOTE — ED Triage Notes (Signed)
Pt is here for perstent brown/yellow d/c   Was seen at [facility] ED on 4/14 for similar sx  Tested neg for GC/Chlam, Trich  Given Diflucan and Flagyl w/no relief.   Sexually active w/occasional condom use  Denies urinary sx.   A&O x4... NAD

## 2016-05-15 NOTE — ED Provider Notes (Signed)
CSN: [id number]     Arrival date & time 05/15/16  1716 History   First MD Initiated Contact with Patient 05/15/16 1753     Chief Complaint  Patient presents with  . Vaginal Discharge   (Consider location/radiation/quality/duration/timing/severity/associated sxs/prior Treatment) 36 year old female presents to clinic for evaluation of a malodorous brown to yellow vaginal discharge. Been present for 2 weeks, she was seen on April 14 , was tested for and diagnosed with bacterial vaginosis, she was negative for gonorrhea, chlamydia, Trichomonas, and for yeast. She was given Diflucan, and metronidazole, states she did have some initial improvement in symptoms, however they have returned and worsened. She denies having had intercourse in the previous 14 days since her last testing. She is married, and monogamous relationship. She denies any pelvic pain, nausea, vomiting, fever, chills, abdominal pain or other symptoms.   The history is provided by the patient.    History reviewed. No pertinent past medical history. History reviewed. No pertinent surgical history. No family history on file. Social History  Substance Use Topics  . Smoking status: Never Smoker  . Smokeless tobacco: Never Used  . Alcohol use Yes     Comment: social   OB History    No data available     Review of Systems  Constitutional: Negative.   HENT: Negative.   Respiratory: Negative.   Cardiovascular: Negative.   Gastrointestinal: Negative.   Genitourinary: Positive for vaginal discharge. Negative for dyspareunia, dysuria, flank pain, frequency, genital sores, menstrual problem, pelvic pain and vaginal bleeding.  Musculoskeletal: Negative.   Skin: Negative.   Neurological: Negative.     Allergies  Patient has no known allergies.  Home Medications   Prior to Admission medications   Medication Sig Start Date End Date Taking? Authorizing Provider  ibuprofen (ADVIL,MOTRIN) 200 MG tablet Take 200 mg by  mouth every 6 (six) hours as needed for mild pain.   Yes Historical Provider, MD  fluconazole (DIFLUCAN) 200 MG tablet Take 1 tablet today, wait 3 days, then take the second tablet 05/15/16   Dorena Bodo, NP  trimethoprim-polymyxin b (POLYTRIM) ophthalmic solution Place 2 drops into the right eye every 4 (four) hours. 10/05/15   Chase Picket Ward, PA-C   Meds Ordered and Administered this Visit   Medications  metroNIDAZOLE (FLAGYL) tablet 2,000 mg (not administered)  ondansetron (ZOFRAN-ODT) disintegrating tablet 8 mg (not administered)  famotidine (PEPCID) tablet 20 mg (not administered)    BP 110/68 (BP Location: Left Arm)   Pulse 77   Temp 98.6 F (37 C) (Oral)   Resp 16   SpO2 100%  No data found.   Physical Exam  Constitutional: She is oriented to person, place, and time. She appears well-developed and well-nourished. No distress.  HENT:  Head: Normocephalic and atraumatic.  Eyes: Conjunctivae are normal. Right eye exhibits no discharge. Left eye exhibits no discharge.  Cardiovascular: Normal rate and regular rhythm.   Pulmonary/Chest: Effort normal and breath sounds normal.  Abdominal: Soft. Bowel sounds are normal. She exhibits no distension. There is no tenderness. There is no guarding.  Genitourinary: Pelvic exam was performed with patient supine. There is no rash, tenderness or injury on the right labia. There is no rash, tenderness or injury on the left labia.  Neurological: She is alert and oriented to person, place, and time.  Skin: Skin is warm and dry. Capillary refill takes less than 2 seconds. She is not diaphoretic.  Psychiatric: She has a normal mood and affect.  Nursing note  and vitals reviewed.   Urgent Care Course     Procedures (including critical care time)  Labs Review Labs Reviewed  POCT URINALYSIS DIP (DEVICE) - Abnormal; Notable for the following:       Result Value   Hgb urine dipstick TRACE (*)    All other components within normal  limits  CERVICOVAGINAL ANCILLARY ONLY    Imaging Review No results found.     MDM   1. BV (bacterial vaginosis)    Full pelvic exam deferred, Cyto probe inserted and set to lab for testing. Given metronidazole in clinic. Will notify of the results in 3-5 business days if positive.     Dorena Bodo, NP 05/15/16 [phone]

## 2016-05-17 LAB — CERVICOVAGINAL ANCILLARY ONLY
BACTERIAL VAGINITIS: NEGATIVE
CANDIDA VAGINITIS: NEGATIVE
CHLAMYDIA, DNA PROBE: NEGATIVE
Neisseria Gonorrhea: NEGATIVE
TRICH (WINDOWPATH): NEGATIVE

## 2016-06-17 ENCOUNTER — Inpatient Hospital Stay (HOSPITAL_COMMUNITY): Payer: BLUE CROSS/BLUE SHIELD

## 2016-06-17 ENCOUNTER — Encounter (HOSPITAL_COMMUNITY): Payer: Self-pay | Admitting: *Deleted

## 2016-06-17 ENCOUNTER — Inpatient Hospital Stay (HOSPITAL_COMMUNITY)
Admission: AD | Admit: 2016-06-17 | Discharge: 2016-06-17 | Disposition: A | Discharge status: Home / Self Care | Payer: BLUE CROSS/BLUE SHIELD | Source: Ambulatory Visit | Attending: Family Medicine | Admitting: Family Medicine

## 2016-06-17 DIAGNOSIS — R109 Unspecified abdominal pain: Secondary | ICD-10-CM

## 2016-06-17 DIAGNOSIS — O26891 Other specified pregnancy related conditions, first trimester: Secondary | ICD-10-CM

## 2016-06-17 DIAGNOSIS — O26899 Other specified pregnancy related conditions, unspecified trimester: Secondary | ICD-10-CM

## 2016-06-17 DIAGNOSIS — Z3A01 Less than 8 weeks gestation of pregnancy: Secondary | ICD-10-CM | POA: Insufficient documentation

## 2016-06-17 DIAGNOSIS — R1030 Lower abdominal pain, unspecified: Secondary | ICD-10-CM | POA: Diagnosis present

## 2016-06-17 DIAGNOSIS — Z79899 Other long term (current) drug therapy: Secondary | ICD-10-CM | POA: Insufficient documentation

## 2016-06-17 LAB — CBC WITH DIFFERENTIAL/PLATELET
BASOS ABS: 0 10*3/uL (ref 0.0–0.1)
Basophils Relative: 0 %
EOS PCT: 1 %
Eosinophils Absolute: 0.1 10*3/uL (ref 0.0–0.7)
HEMATOCRIT: 38.2 % (ref 36.0–46.0)
Hemoglobin: 13.1 g/dL (ref 12.0–15.0)
LYMPHS ABS: 3.8 10*3/uL (ref 0.7–4.0)
LYMPHS PCT: 38 %
MCH: 32.2 pg (ref 26.0–34.0)
MCHC: 34.3 g/dL (ref 30.0–36.0)
MCV: 93.9 fL (ref 78.0–100.0)
MONO ABS: 0.3 10*3/uL (ref 0.1–1.0)
Monocytes Relative: 3 %
NEUTROS ABS: 5.8 10*3/uL (ref 1.7–7.7)
Neutrophils Relative %: 58 %
PLATELETS: 340 10*3/uL (ref 150–400)
RBC: 4.07 MIL/uL (ref 3.87–5.11)
RDW: 12.8 % (ref 11.5–15.5)
WBC: 10 10*3/uL (ref 4.0–10.5)

## 2016-06-17 LAB — ABO/RH: ABO/RH(D): O POS

## 2016-06-17 LAB — HCG, QUANTITATIVE, PREGNANCY: hCG, Beta Chain, Quant, S: 25844 m[IU]/mL — ABNORMAL HIGH (ref <5)

## 2016-06-17 LAB — URINALYSIS, ROUTINE W REFLEX MICROSCOPIC
BILIRUBIN URINE: NEGATIVE
Glucose, UA: NEGATIVE mg/dL
HGB URINE DIPSTICK: NEGATIVE
Ketones, ur: NEGATIVE mg/dL
Leukocytes, UA: NEGATIVE
Nitrite: NEGATIVE
PH: 6 (ref 5.0–8.0)
Protein, ur: NEGATIVE mg/dL
SPECIFIC GRAVITY, URINE: 1.01 (ref 1.005–1.030)

## 2016-06-17 LAB — POCT PREGNANCY, URINE: PREG TEST UR: POSITIVE — AB

## 2016-06-17 LAB — WET PREP, GENITAL
Clue Cells Wet Prep HPF POC: NONE SEEN
SPERM: NONE SEEN
TRICH WET PREP: NONE SEEN
YEAST WET PREP: NONE SEEN

## 2016-06-17 NOTE — MAU Note (Signed)
Pt reports cramping and vaginal irritation. Pt had a positive pregnancy test at home.

## 2016-06-17 NOTE — Discharge Instructions (Signed)
First Trimester of Pregnancy The first trimester of pregnancy is from week 1 until the end of week 13 (months 1 through 3). During this time, your baby will begin to develop inside you. At 6-8 weeks, the eyes and face are formed, and the heartbeat can be seen on ultrasound. At the end of 12 weeks, all the baby's organs are formed. Prenatal care is all the medical care you receive before the birth of your baby. Make sure you get good prenatal care and follow all of your doctor's instructions. Follow these instructions at home: Medicines  Take over-the-counter and prescription medicines only as told by your doctor. Some medicines are safe and some medicines are not safe during pregnancy.  Take a prenatal vitamin that contains at least 600 micrograms (mcg) of folic acid.  If you have trouble pooping (constipation), take medicine that will make your stool soft (stool softener) if your doctor approves. Eating and drinking  Eat regular, healthy meals.  Your doctor will tell you the amount of weight gain that is right for you.  Avoid raw meat and uncooked cheese.  If you feel sick to your stomach (nauseous) or throw up (vomit): ? Eat 4 or 5 small meals a day instead of 3 large meals. ? Try eating a few soda crackers. ? Drink liquids between meals instead of during meals.  To prevent constipation: ? Eat foods that are high in fiber, like fresh fruits and vegetables, whole grains, and beans. ? Drink enough fluids to keep your pee (urine) clear or pale yellow. Activity  Exercise only as told by your doctor. Stop exercising if you have cramps or pain in your lower belly (abdomen) or low back.  Do not exercise if it is too hot, too humid, or if you are in a place of great height (high altitude).  Try to avoid standing for long periods of time. Move your legs often if you must stand in one place for a long time.  Avoid heavy lifting.  Wear low-heeled shoes. Sit and stand up straight.  You  can have sex unless your doctor tells you not to. Relieving pain and discomfort  Wear a good support bra if your breasts are sore.  Take warm water baths (sitz baths) to soothe pain or discomfort caused by hemorrhoids. Use hemorrhoid cream if your doctor says it is okay.  Rest with your legs raised if you have leg cramps or low back pain.  If you have puffy, bulging veins (varicose veins) in your legs: ? Wear support hose or compression stockings as told by your doctor. ? Raise (elevate) your feet for 15 minutes, 3-4 times a day. ? Limit salt in your food. Prenatal care  Schedule your prenatal visits by the twelfth week of pregnancy.  Write down your questions. Take them to your prenatal visits.  Keep all your prenatal visits as told by your doctor. This is important. Safety  Wear your seat belt at all times when driving.  Make a list of emergency phone numbers. The list should include numbers for family, friends, the hospital, and police and fire departments. General instructions  Ask your doctor for a referral to a local prenatal class. Begin classes no later than at the start of month 6 of your pregnancy.  Ask for help if you need counseling or if you need help with nutrition. Your doctor can give you advice or tell you where to go for help.  Do not use hot tubs, steam rooms, or   saunas.  Do not douche or use tampons or scented sanitary pads.  Do not cross your legs for long periods of time.  Avoid all herbs and alcohol. Avoid drugs that are not approved by your doctor.  Do not use any tobacco products, including cigarettes, chewing tobacco, and electronic cigarettes. If you need help quitting, ask your doctor. You may get counseling or other support to help you quit.  Avoid cat litter boxes and soil used by cats. These carry germs that can cause birth defects in the baby and can cause a loss of your baby (miscarriage) or stillbirth.  Visit your dentist. At home, brush  your teeth with a soft toothbrush. Be gentle when you floss. Contact a doctor if:  You are dizzy.  You have mild cramps or pressure in your lower belly.  You have a nagging pain in your belly area.  You continue to feel sick to your stomach, you throw up, or you have watery poop (diarrhea).  You have a bad smelling fluid coming from your vagina.  You have pain when you pee (urinate).  You have increased puffiness (swelling) in your face, hands, legs, or ankles. Get help right away if:  You have a fever.  You are leaking fluid from your vagina.  You have spotting or bleeding from your vagina.  You have very bad belly cramping or pain.  You gain or lose weight rapidly.  You throw up blood. It may look like coffee grounds.  You are around people who have German measles, fifth disease, or chickenpox.  You have a very bad headache.  You have shortness of breath.  You have any kind of trauma, such as from a fall or a car accident. Summary  The first trimester of pregnancy is from week 1 until the end of week 13 (months 1 through 3).  To take care of yourself and your unborn baby, you will need to eat healthy meals, take medicines only if your doctor tells you to do so, and do activities that are safe for you and your baby.  Keep all follow-up visits as told by your doctor. This is important as your doctor will have to ensure that your baby is healthy and growing well. This information is not intended to replace advice given to you by your health care provider. Make sure you discuss any questions you have with your health care provider. Document Released: 06/23/2007 Document Revised: 01/13/2016 Document Reviewed: 01/13/2016 Elsevier Interactive Patient Education  2017 [employer]  

## 2016-06-17 NOTE — MAU Note (Signed)
Recently found out preg, 3+ tests on 5/28.  Also confirmed at Urgent Care.  Is having cramping.  Never been pregnant before, is scared.  Has had some vag irritation.

## 2016-06-17 NOTE — MAU Provider Note (Signed)
History     CSN: [id number]  Arrival date and time: 06/17/16 1704   First Provider Initiated Contact with Patient 06/17/16 2005      Chief Complaint  Patient presents with  . Possible Pregnancy  . Abdominal Pain   HPI Ms. Jaime Frost is a 36 y.o. G1P0 at [redacted]w[redacted]d who presents to MAU today with complaint of lower abdominal pain. She states pain is mild and intermittent. She denies bleeding. She has had some white discharge with irritation. She denies UTI symptoms, N/V/D or constipation or fever. She had +HPT recently. She states LMP 05/11/16.   OB History    Gravida Para Term Preterm AB Living   1             SAB TAB Ectopic Multiple Live Births                  Past Medical History:  Diagnosis Date  . Medical history non-contributory     Past Surgical History:  Procedure Laterality Date  . NO PAST SURGERIES      No family history on file.  Social History  Substance Use Topics  . Smoking status: Never Smoker  . Smokeless tobacco: Never Used  . Alcohol use Yes     Comment: social    Allergies: No Known Allergies  Prescriptions Prior to Admission  Medication Sig Dispense Refill Last Dose  . fluconazole (DIFLUCAN) 200 MG tablet Take 1 tablet today, wait 3 days, then take the second tablet 2 tablet 0   . ibuprofen (ADVIL,MOTRIN) 200 MG tablet Take 200 mg by mouth every 6 (six) hours as needed for mild pain.   Past Month at Unknown time  . trimethoprim-polymyxin b (POLYTRIM) ophthalmic solution Place 2 drops into the right eye every 4 (four) hours. 10 mL 0 Unknown at Unknown time    Review of Systems  Constitutional: Negative for fever.  Gastrointestinal: Positive for abdominal pain. Negative for constipation, diarrhea, nausea and vomiting.  Genitourinary: Positive for vaginal discharge. Negative for dysuria, frequency, urgency and vaginal bleeding.   Physical Exam   Blood pressure 110/69, pulse 94, temperature 99.4 F (37.4 C), temperature source Oral, resp.  rate 18, height 5' 0.5" (1.537 m), weight 117 lb 12 oz (53.4 kg), last menstrual period 05/11/2016, SpO2 100 %.  Physical Exam  Nursing note and vitals reviewed. Constitutional: She is oriented to person, place, and time. She appears well-developed and well-nourished. No distress.  HENT:  Head: Normocephalic and atraumatic.  Cardiovascular: Normal rate.   Respiratory: Effort normal.  GI: Soft. She exhibits no distension and no mass. There is no tenderness. There is no rebound and no guarding.  Genitourinary: Uterus is not enlarged and not tender. Cervix exhibits no motion tenderness, no discharge and no friability. Right adnexum displays no mass and no tenderness. Left adnexum displays no mass and no tenderness. No bleeding in the vagina. Vaginal discharge (small white) found.  Neurological: She is alert and oriented to person, place, and time.  Skin: Skin is warm and dry. No erythema.  Psychiatric: She has a normal mood and affect.    Results for orders placed or performed during the hospital encounter of 06/17/16 (from the past 24 hour(s))  Urinalysis, Routine w reflex microscopic     Status: None   Collection Time: 06/17/16  5:33 PM  Result Value Ref Range   Color, Urine YELLOW YELLOW   APPearance CLEAR CLEAR   Specific Gravity, Urine 1.010 1.005 - 1.030   pH  6.0 5.0 - 8.0   Glucose, UA NEGATIVE NEGATIVE mg/dL   Hgb urine dipstick NEGATIVE NEGATIVE   Bilirubin Urine NEGATIVE NEGATIVE   Ketones, ur NEGATIVE NEGATIVE mg/dL   Protein, ur NEGATIVE NEGATIVE mg/dL   Nitrite NEGATIVE NEGATIVE   Leukocytes, UA NEGATIVE NEGATIVE  Pregnancy, urine POC     Status: Abnormal   Collection Time: 06/17/16  5:38 PM  Result Value Ref Range   Preg Test, Ur POSITIVE (A) NEGATIVE  CBC with Differential/Platelet     Status: None   Collection Time: 06/17/16  5:48 PM  Result Value Ref Range   WBC 10.0 4.0 - 10.5 K/uL   RBC 4.07 3.87 - 5.11 MIL/uL   Hemoglobin 13.1 12.0 - 15.0 g/dL   HCT 40.9 81.1  - 91.4 %   MCV 93.9 78.0 - 100.0 fL   MCH 32.2 26.0 - 34.0 pg   MCHC 34.3 30.0 - 36.0 g/dL   RDW 78.2 95.6 - 21.3 %   Platelets 340 150 - 400 K/uL   Neutrophils Relative % 58 %   Neutro Abs 5.8 1.7 - 7.7 K/uL   Lymphocytes Relative 38 %   Lymphs Abs 3.8 0.7 - 4.0 K/uL   Monocytes Relative 3 %   Monocytes Absolute 0.3 0.1 - 1.0 K/uL   Eosinophils Relative 1 %   Eosinophils Absolute 0.1 0.0 - 0.7 K/uL   Basophils Relative 0 %   Basophils Absolute 0.0 0.0 - 0.1 K/uL  ABO/Rh     Status: None (Preliminary result)   Collection Time: 06/17/16  5:48 PM  Result Value Ref Range   ABO/RH(D) O POS   hCG, quantitative, pregnancy     Status: Abnormal   Collection Time: 06/17/16  5:48 PM  Result Value Ref Range   hCG, Beta Chain, Quant, S 25,844 (H) <5 mIU/mL   US Ob Comp Less 14 Wks  Result Date: 06/17/2016 CLINICAL DATA:  Abdominal and pelvic cramping with positive pregnancy test. EXAM: OBSTETRIC <14 WK [place] AND TRANSVAGINAL OB US TECHNIQUE: Both transabdominal and transvaginal ultrasound examinations were performed for complete evaluation of the gestation as well as the maternal uterus, adnexal regions, and pelvic cul-de-sac. Transvaginal technique was performed to assess early pregnancy. COMPARISON:  None. FINDINGS: Intrauterine gestational sac: Single. Yolk sac:  Visualized. Embryo:  Visualized. Cardiac Activity: Visualized. Heart Rate: 86  bpm CRL:  1.4  mm   5 w   0 d Subchorionic hemorrhage:  None visualized. Maternal uterus/adnexae: No adnexal mass. Two small fibroids are identified, measuring up to 2.2 cm. IMPRESSION: 1. Single living intrauterine gestation at estimated 5 week 0 day gestational age by crown-rump length. Electronically Signed   By: Kennith Center M.D.   On: 06/17/2016 19:33   US Ob Transvaginal  Result Date: 06/17/2016 CLINICAL DATA:  Abdominal and pelvic cramping with positive pregnancy test. EXAM: OBSTETRIC <14 WK [place] AND TRANSVAGINAL OB US TECHNIQUE: Both transabdominal and  transvaginal ultrasound examinations were performed for complete evaluation of the gestation as well as the maternal uterus, adnexal regions, and pelvic cul-de-sac. Transvaginal technique was performed to assess early pregnancy. COMPARISON:  None. FINDINGS: Intrauterine gestational sac: Single. Yolk sac:  Visualized. Embryo:  Visualized. Cardiac Activity: Visualized. Heart Rate: 86  bpm CRL:  1.4  mm   5 w   0 d Subchorionic hemorrhage:  None visualized. Maternal uterus/adnexae: No adnexal mass. Two small fibroids are identified, measuring up to 2.2 cm. IMPRESSION: 1. Single living intrauterine gestation at estimated 5 week 0 day  gestational age by crown-rump length. Electronically Signed   By: Kennith CenterEric  Mansell M.D.   On: 06/17/2016 19:33    MAU Course  Procedures None  MDM +UPT UA, wet prep, GC/chlamydia, CBC, ABO/Rh, quant hCG, HIV, RPR and US today to rule out ectopic pregnancy  Assessment and Plan  A: SIUP at 3357w2d Abdominal pain in pregnancy, first trimester  P:  Discharge home Wet prep pending First trimester precautions discussed Patient advised to follow-up with OB Provider of Choice to start prenatal care Patient may return to MAU as needed or if her condition were to change or worsen  Vonzella NippleJulie Wenzel, PA-C 06/17/2016, 8:12 PM

## 2016-06-18 LAB — RPR: RPR: NONREACTIVE

## 2016-06-18 LAB — GC/CHLAMYDIA PROBE AMP (~~LOC~~) NOT AT ARMC
Chlamydia: NEGATIVE
Neisseria Gonorrhea: NEGATIVE

## 2016-06-18 LAB — HIV ANTIBODY (ROUTINE TESTING W REFLEX): HIV Screen 4th Generation wRfx: NONREACTIVE

## 2016-07-19 ENCOUNTER — Encounter (HOSPITAL_COMMUNITY): Payer: Self-pay | Admitting: *Deleted

## 2016-07-19 ENCOUNTER — Inpatient Hospital Stay (HOSPITAL_COMMUNITY)
Admission: AD | Admit: 2016-07-19 | Discharge: 2016-07-19 | Disposition: A | Discharge status: Home / Self Care | Payer: BLUE CROSS/BLUE SHIELD | Source: Ambulatory Visit | Attending: Obstetrics and Gynecology | Admitting: Obstetrics and Gynecology

## 2016-07-19 DIAGNOSIS — O26891 Other specified pregnancy related conditions, first trimester: Secondary | ICD-10-CM | POA: Diagnosis not present

## 2016-07-19 DIAGNOSIS — R109 Unspecified abdominal pain: Secondary | ICD-10-CM

## 2016-07-19 DIAGNOSIS — O9989 Other specified diseases and conditions complicating pregnancy, childbirth and the puerperium: Secondary | ICD-10-CM

## 2016-07-19 DIAGNOSIS — R1032 Left lower quadrant pain: Secondary | ICD-10-CM | POA: Diagnosis present

## 2016-07-19 DIAGNOSIS — R198 Other specified symptoms and signs involving the digestive system and abdomen: Secondary | ICD-10-CM

## 2016-07-19 DIAGNOSIS — Z3A09 9 weeks gestation of pregnancy: Secondary | ICD-10-CM | POA: Insufficient documentation

## 2016-07-19 LAB — URINALYSIS, ROUTINE W REFLEX MICROSCOPIC
Bilirubin Urine: NEGATIVE
GLUCOSE, UA: NEGATIVE mg/dL
Hgb urine dipstick: NEGATIVE
Ketones, ur: NEGATIVE mg/dL
Leukocytes, UA: NEGATIVE
Nitrite: NEGATIVE
PH: 7 (ref 5.0–8.0)
PROTEIN: NEGATIVE mg/dL
SPECIFIC GRAVITY, URINE: 1.019 (ref 1.005–1.030)

## 2016-07-19 NOTE — MAU Note (Signed)
+  left abdominal pain Cramping Worse with movement Rating pain 10/10 Has not taken any medication for the pain

## 2016-07-19 NOTE — MAU Provider Note (Signed)
Chief Complaint: Abdominal Pain   First Provider Initiated Contact with Patient 07/19/16 1731        SUBJECTIVE HPI: Jaime Frost is a 36 y.o. G1P0 at [redacted]w[redacted]d by LMP who presents to maternity admissions reporting pain in her left lower abdomen for a day or two.  Has alternating diarrhea and constipation.  No bleeding. Worried she is losing the baby. She denies vaginal bleeding, vaginal itching/burning, urinary symptoms, h/a, dizziness, n/v, or fever/chills.     Abdominal Pain  This is a new problem. The current episode started yesterday. The onset quality is gradual. The problem occurs intermittently. The problem has been unchanged. The pain is located in the LLQ. The quality of the pain is cramping and colicky. The abdominal pain does not radiate. Associated symptoms include constipation and diarrhea. Pertinent negatives include no fever, frequency, myalgias, nausea or vomiting. The pain is aggravated by palpation. The pain is relieved by nothing. She has tried nothing for the symptoms.   RN Note: +left abdominal pain, Cramping, Worse with movement Rating pain 10/10 Has not taken any medication for the pain  Past Medical History:  Diagnosis Date  . Medical history non-contributory    Past Surgical History:  Procedure Laterality Date  . NO PAST SURGERIES     Social History   Social History  . Marital status: Single    Spouse name: N/A  . Number of children: N/A  . Years of education: N/A   Occupational History  . Not on file.   Social History Main Topics  . Smoking status: Never Smoker  . Smokeless tobacco: Never Used  . Alcohol use Yes     Comment: none since pregnancy  . Drug use: No  . Sexual activity: Yes   Other Topics Concern  . Not on file   Social History Narrative  . No narrative on file   No current facility-administered medications on file prior to encounter.    Current Outpatient Prescriptions on File Prior to Encounter  Medication Sig Dispense Refill   . trimethoprim-polymyxin b (POLYTRIM) ophthalmic solution Place 2 drops into the right eye every 4 (four) hours. 10 mL 0   No Known Allergies  I have reviewed patient's Past Medical Hx, Surgical Hx, Family Hx, Social Hx, medications and allergies.   ROS:  Review of Systems  Constitutional: Negative for fever.  Gastrointestinal: Positive for abdominal pain, constipation and diarrhea. Negative for nausea and vomiting.  Genitourinary: Negative for frequency.  Musculoskeletal: Negative for myalgias.   Review of Systems  Other systems negative   Physical Exam  Physical Exam Patient Vitals for the past 24 hrs:  BP Temp Temp src Pulse Resp SpO2 Weight  07/19/16 1612 126/90 97.4 F (36.3 C) Oral 75 16 100 % 123 lb 0.5 oz (55.8 kg)   Constitutional: Well-developed, well-nourished female in no acute distress.  Cardiovascular: normal rate Respiratory: normal effort GI: Abd soft, non-tender, except for over middle of left side of abdomen  No rebound or guarding. Pos BS x 4 MS: Extremities nontender, no edema, normal ROM Neurologic: Alert and oriented x 4.  GU: Neg CVAT.  PELVIC EXAM: Cervix long/firm/closed  FHT 160s by bedside US  LAB RESULTS Results for orders placed or performed during the hospital encounter of 07/19/16 (from the past 24 hour(s))  Urinalysis, Routine w reflex microscopic     Status: None   Collection Time: 07/19/16  4:11 PM  Result Value Ref Range   Color, Urine YELLOW YELLOW   APPearance CLEAR  CLEAR   Specific Gravity, Urine 1.019 1.005 - 1.030   pH 7.0 5.0 - 8.0   Glucose, UA NEGATIVE NEGATIVE mg/dL   Hgb urine dipstick NEGATIVE NEGATIVE   Bilirubin Urine NEGATIVE NEGATIVE   Ketones, ur NEGATIVE NEGATIVE mg/dL   Protein, ur NEGATIVE NEGATIVE mg/dL   Nitrite NEGATIVE NEGATIVE   Leukocytes, UA NEGATIVE NEGATIVE    --/--/O POS (05/31 1748)  IMAGING Informal bedside US for viability.  Single gestational sac with single fetus.  Fetus is active.  FHR  160s and regular.  Placenta anterior.  Measures about 9 wks  MAU Management/MDM: Reassured by seeing baby on ultrasound   Very happy to see HR and movement Discussed pain is likely intestinal, as it is located above where uterus is Has had one visit at office, and cultures were done there, so deferred now   ASSESSMENT Single IUP at [id number] Left middle abdominal pain + FHT Cervix long and closed  PLAN Discharge home Supportive care Plenty of fluids, notify MD if diarrhea or constipation worsen  Pt stable at time of discharge. Encouraged to return here or to other Urgent Care/ED if she develops worsening of symptoms, increase in pain, fever, or other concerning symptoms.    Wynelle BourgeoisMarie Talayia Hjort CNM, MSN Certified Nurse-Midwife 07/19/2016  5:31 PM

## 2016-07-19 NOTE — Discharge Instructions (Signed)
Irritable Bowel Syndrome, Adult Irritable bowel syndrome (IBS) is not one specific disease. It is a group of symptoms that affects the organs responsible for digestion (gastrointestinal or GI tract). To regulate how your GI tract works, your body sends signals back and forth between your intestines and your brain. If you have IBS, there may be a problem with these signals. As a result, your GI tract does not function normally. Your intestines may become more sensitive and overreact to certain things. This is especially true when you eat certain foods or when you are under stress. There are four types of IBS. These may be determined based on the consistency of your stool: IBS with diarrhea. First Trimester of Pregnancy The first trimester of pregnancy is from week 1 until the end of week 13 (months 1 through 3). A week after a sperm fertilizes an egg, the egg will implant on the wall of the uterus. This embryo will begin to develop into a baby. Genes from you and your partner will form the baby. The female genes will determine whether the baby will be a boy or a girl. At 6-8 weeks, the eyes and face will be formed, and the heartbeat can be seen on ultrasound. At the end of 12 weeks, all the baby's organs will be formed. Now that you are pregnant, you will want to do everything you can to have a healthy baby. Two of the most important things are to get good prenatal care and to follow your health care provider's instructions. Prenatal care is all the medical care you receive before the baby's birth. This care will help prevent, find, and treat any problems during the pregnancy and childbirth. Body changes during your first trimester Your body goes through many changes during pregnancy. The changes vary from woman to woman. You may gain or lose a couple of pounds at first. You may feel sick to your stomach (nauseous) and you may throw up (vomit). If the vomiting is uncontrollable, call your health care  provider. You may tire easily. You may develop headaches that can be relieved by medicines. All medicines should be approved by your health care provider. You may urinate more often. Painful urination may mean you have a bladder infection. You may develop heartburn as a result of your pregnancy. You may develop constipation because certain hormones are causing the muscles that push stool through your intestines to slow down. You may develop hemorrhoids or swollen veins (varicose veins). Your breasts may begin to grow larger and become tender. Your nipples may stick out more, and the tissue that surrounds them (areola) may become darker. Your gums may bleed and may be sensitive to brushing and flossing. Dark spots or blotches (chloasma, mask of pregnancy) may develop on your face. This will likely fade after the baby is born. Your menstrual periods will stop. You may have a loss of appetite. You may develop cravings for certain kinds of food. You may have changes in your emotions from day to day, such as being excited to be pregnant or being concerned that something may go wrong with the pregnancy and baby. You may have more vivid and strange dreams. You may have changes in your hair. These can include thickening of your hair, rapid growth, and changes in texture. Some women also have hair loss during or after pregnancy, or hair that feels dry or thin. Your hair will most likely return to normal after your baby is born.  What to expect at prenatal  visits During a routine prenatal visit: You will be weighed to make sure you and the baby are growing normally. Your blood pressure will be taken. Your abdomen will be measured to track your baby's growth. The fetal heartbeat will be listened to between weeks 10 and 14 of your pregnancy. Test results from any previous visits will be discussed.  Your health care provider may ask you: How you are feeling. If you are feeling the baby move. If you  have had any abnormal symptoms, such as leaking fluid, bleeding, severe headaches, or abdominal cramping. If you are using any tobacco products, including cigarettes, chewing tobacco, and electronic cigarettes. If you have any questions.  Other tests that may be performed during your first trimester include: Blood tests to find your blood type and to check for the presence of any previous infections. The tests will also be used to check for low iron levels (anemia) and protein on red blood cells (Rh antibodies). Depending on your risk factors, or if you previously had diabetes during pregnancy, you may have tests to check for high blood sugar that affects pregnant women (gestational diabetes). Urine tests to check for infections, diabetes, or protein in the urine. An ultrasound to confirm the proper growth and development of the baby. Fetal screens for spinal cord problems (spina bifida) and Down syndrome. HIV (human immunodeficiency virus) testing. Routine prenatal testing includes screening for HIV, unless you choose not to have this test. You may need other tests to make sure you and the baby are doing well.  Follow these instructions at home: Medicines Follow your health care provider's instructions regarding medicine use. Specific medicines may be either safe or unsafe to take during pregnancy. Take a prenatal vitamin that contains at least 600 micrograms (mcg) of folic acid. If you develop constipation, try taking a stool softener if your health care provider approves. Eating and drinking Eat a balanced diet that includes fresh fruits and vegetables, whole grains, good sources of protein such as meat, eggs, or tofu, and low-fat dairy. Your health care provider will help you determine the amount of weight gain that is right for you. Avoid raw meat and uncooked cheese. These carry germs that can cause birth defects in the baby. Eating four or five small meals rather than three large meals a  day may help relieve nausea and vomiting. If you start to feel nauseous, eating a few soda crackers can be helpful. Drinking liquids between meals, instead of during meals, also seems to help ease nausea and vomiting. Limit foods that are high in fat and processed sugars, such as fried and sweet foods. To prevent constipation: Eat foods that are high in fiber, such as fresh fruits and vegetables, whole grains, and beans. Drink enough fluid to keep your urine clear or pale yellow. Activity Exercise only as directed by your health care provider. Most women can continue their usual exercise routine during pregnancy. Try to exercise for 30 minutes at least 5 days a week. Exercising will help you: Control your weight. Stay in shape. Be prepared for labor and delivery. Experiencing pain or cramping in the lower abdomen or lower back is a good sign that you should stop exercising. Check with your health care provider before continuing with normal exercises. Try to avoid standing for long periods of time. Move your legs often if you must stand in one place for a long time. Avoid heavy lifting. Wear low-heeled shoes and practice good posture. You may continue to have  sex unless your health care provider tells you not to. Relieving pain and discomfort Wear a good support bra to relieve breast tenderness. Take warm sitz baths to soothe any pain or discomfort caused by hemorrhoids. Use hemorrhoid cream if your health care provider approves. Rest with your legs elevated if you have leg cramps or low back pain. If you develop varicose veins in your legs, wear support hose. Elevate your feet for 15 minutes, 3-4 times a day. Limit salt in your diet. Prenatal care Schedule your prenatal visits by the twelfth week of pregnancy. They are usually scheduled monthly at first, then more often in the last 2 months before delivery. Write down your questions. Take them to your prenatal visits. Keep all your prenatal  visits as told by your health care provider. This is important. Safety Wear your seat belt at all times when driving. Make a list of emergency phone numbers, including numbers for family, friends, the hospital, and police and fire departments. General instructions Ask your health care provider for a referral to a local prenatal education class. Begin classes no later than the beginning of month 6 of your pregnancy. Ask for help if you have counseling or nutritional needs during pregnancy. Your health care provider can offer advice or refer you to specialists for help with various needs. Do not use hot tubs, steam rooms, or saunas. Do not douche or use tampons or scented sanitary pads. Do not cross your legs for long periods of time. Avoid cat litter boxes and soil used by cats. These carry germs that can cause birth defects in the baby and possibly loss of the fetus by miscarriage or stillbirth. Avoid all smoking, herbs, alcohol, and medicines not prescribed by your health care provider. Chemicals in these products affect the formation and growth of the baby. Do not use any products that contain nicotine or tobacco, such as cigarettes and e-cigarettes. If you need help quitting, ask your health care provider. You may receive counseling support and other resources to help you quit. Schedule a dentist appointment. At home, brush your teeth with a soft toothbrush and be gentle when you floss. Contact a health care provider if: You have dizziness. You have mild pelvic cramps, pelvic pressure, or nagging pain in the abdominal area. You have persistent nausea, vomiting, or diarrhea. You have a bad smelling vaginal discharge. You have pain when you urinate. You notice increased swelling in your face, hands, legs, or ankles. You are exposed to fifth disease or chickenpox. You are exposed to [place] measles (rubella) and have never had it. Get help right away if: You have a fever. You are leaking  fluid from your vagina. You have spotting or bleeding from your vagina. You have severe abdominal cramping or pain. You have rapid weight gain or loss. You vomit blood or material that looks like coffee grounds. You develop a severe headache. You have shortness of breath. You have any kind of trauma, such as from a fall or a car accident. Summary The first trimester of pregnancy is from week 1 until the end of week 13 (months 1 through 3). Your body goes through many changes during pregnancy. The changes vary from woman to woman. You will have routine prenatal visits. During those visits, your health care provider will examine you, discuss any test results you may have, and talk with you about how you are feeling. This information is not intended to replace advice given to you by your health care provider. Make  sure you discuss any questions you have with your health care provider. Document Released: 12/29/2000 Document Revised: 12/17/2015 Document Reviewed: 12/17/2015 Elsevier Interactive Patient Education  2017 [employer].    IBS with constipation.  Mixed IBS.  Unsubtyped IBS.  It is important to know which type of IBS you have. Some treatments are more likely to be helpful for certain types of IBS. What are the causes? The exact cause of IBS is not known. What increases the risk? You may have a higher risk of IBS if:  You are a woman.  You are younger than 36 years old.  You have a family history of IBS.  You have mental health problems.  You have had bacterial infection of your GI tract.  What are the signs or symptoms? Symptoms of IBS vary from person to person. The main symptom is abdominal pain or discomfort. Additional symptoms usually include one or more of the following:  Diarrhea, constipation, or both.  Abdominal swelling or bloating.  Feeling full or sick after eating a small or regular-size meal.  Frequent gas.  Mucus in the stool.  A feeling of  having more stool left after a bowel movement.  Symptoms tend to come and go. They may be associated with stress, psychiatric conditions, or nothing at all. How is this diagnosed? There is no specific test to diagnose IBS. Your health care provider will make a diagnosis based on a physical exam, medical history, and your symptoms. You may have other tests to rule out other conditions that may be causing your symptoms. These may include:  Blood tests.  X-rays.  CT scan.  Endoscopy and colonoscopy. This is a test in which your GI tract is viewed with a long, thin, flexible tube.  How is this treated? There is no cure for IBS, but treatment can help relieve symptoms. IBS treatment often includes:  Changes to your diet, such as: ? Eating more fiber. ? Avoiding foods that cause symptoms. ? Drinking more water. ? Eating regular, medium-sized portioned meals.  Medicines. These may include: ? Fiber supplements if you have constipation. ? Medicine to control diarrhea (antidiarrheal medicines). ? Medicine to help control muscle spasms in your GI tract (antispasmodic medicines). ? Medicines to help with any mental health issues, such as antidepressants or tranquilizers.  Therapy. ? Talk therapy may help with anxiety, depression, or other mental health issues that can make IBS symptoms worse.  Stress reduction. ? Managing your stress can help keep symptoms under control.  Follow these instructions at home:  Take medicines only as directed by your health care provider.  Eat a healthy diet. ? Avoid foods and drinks with added sugar. ? Include more whole grains, fruits, and vegetables gradually into your diet. This may be especially helpful if you have IBS with constipation. ? Avoid any foods and drinks that make your symptoms worse. These may include dairy products and caffeinated or carbonated drinks. ? Do not eat large meals. ? Drink enough fluid to keep your urine clear or pale  yellow.  Exercise regularly. Ask your health care provider for recommendations of good activities for you.  Keep all follow-up visits as directed by your health care provider. This is important. Contact a health care provider if:  You have constant pain.  You have trouble or pain with swallowing.  You have worsening diarrhea. Get help right away if:  You have severe and worsening abdominal pain.  You have diarrhea and: ? You have a rash, stiff  neck, or severe headache. ? You are irritable, sleepy, or difficult to awaken. ? You are weak, dizzy, or extremely thirsty.  You have bright red blood in your stool or you have black tarry stools.  You have unusual abdominal swelling that is painful.  You vomit continuously.  You vomit blood (hematemesis).  You have both abdominal pain and a fever. This information is not intended to replace advice given to you by your health care provider. Make sure you discuss any questions you have with your health care provider. Document Released: 01/04/2005 Document Revised: 06/06/2015 Document Reviewed: 09/21/2013 Elsevier Interactive Patient Education  2018 [employer].

## 2016-08-04 LAB — OB RESULTS CONSOLE HEPATITIS B SURFACE ANTIGEN: HEP B S AG: NEGATIVE

## 2016-08-04 LAB — OB RESULTS CONSOLE RUBELLA ANTIBODY, IGM: RUBELLA: IMMUNE

## 2016-12-24 ENCOUNTER — Encounter (HOSPITAL_COMMUNITY): Payer: Self-pay | Admitting: *Deleted

## 2016-12-24 ENCOUNTER — Other Ambulatory Visit: Payer: Self-pay

## 2016-12-24 ENCOUNTER — Inpatient Hospital Stay (HOSPITAL_COMMUNITY)
Admission: AD | Admit: 2016-12-24 | Discharge: 2016-12-24 | Disposition: A | Discharge status: Home / Self Care | Payer: Medicaid Other | Source: Ambulatory Visit | Attending: Obstetrics and Gynecology | Admitting: Obstetrics and Gynecology

## 2016-12-24 DIAGNOSIS — O219 Vomiting of pregnancy, unspecified: Secondary | ICD-10-CM

## 2016-12-24 DIAGNOSIS — O212 Late vomiting of pregnancy: Secondary | ICD-10-CM | POA: Diagnosis present

## 2016-12-24 DIAGNOSIS — O99613 Diseases of the digestive system complicating pregnancy, third trimester: Secondary | ICD-10-CM | POA: Diagnosis not present

## 2016-12-24 DIAGNOSIS — O368130 Decreased fetal movements, third trimester, not applicable or unspecified: Secondary | ICD-10-CM | POA: Diagnosis not present

## 2016-12-24 DIAGNOSIS — K529 Noninfective gastroenteritis and colitis, unspecified: Secondary | ICD-10-CM | POA: Insufficient documentation

## 2016-12-24 DIAGNOSIS — Z3A32 32 weeks gestation of pregnancy: Secondary | ICD-10-CM | POA: Diagnosis not present

## 2016-12-24 LAB — URINALYSIS, ROUTINE W REFLEX MICROSCOPIC
BACTERIA UA: NONE SEEN
BILIRUBIN URINE: NEGATIVE
Glucose, UA: NEGATIVE mg/dL
HGB URINE DIPSTICK: NEGATIVE
KETONES UR: NEGATIVE mg/dL
Nitrite: NEGATIVE
PH: 6 (ref 5.0–8.0)
Protein, ur: NEGATIVE mg/dL
Specific Gravity, Urine: 1.017 (ref 1.005–1.030)

## 2016-12-24 MED ORDER — ONDANSETRON 8 MG PO TBDP
8.0000 mg | ORAL_TABLET | Freq: Once | ORAL | Status: AC
  Administered 2016-12-24: 8 mg via ORAL
  Filled 2016-12-24: qty 1

## 2016-12-24 MED ORDER — ONDANSETRON 8 MG PO TBDP: 8.0000 mg | ORAL_TABLET | Freq: Three times a day (TID) | ORAL | 0 refills | Status: DC | PRN

## 2016-12-24 NOTE — MAU Note (Signed)
Pt presents with c/o N/V/D.  Reports had 4-5 episodes of diarrhea is past 24 hours and had thrown up once.  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 Pt also reports decreased FM since yesterday.  Denies VB or LOF.

## 2016-12-24 NOTE — Discharge Instructions (Signed)
Bland Diet A bland diet consists of foods that do not have a lot of fat or fiber. Foods without fat or fiber are easier for the body to digest. They are also less likely to irritate your mouth, throat, stomach, and other parts of your gastrointestinal tract. A bland diet is sometimes called a BRAT diet. What is my plan? Your health care provider or dietitian may recommend specific changes to your diet to prevent and treat your symptoms, such as:  Eating small meals often.  Cooking food until it is soft enough to chew easily.  Chewing your food well.  Drinking fluids slowly.  Not eating foods that are very spicy, sour, or fatty.  Not eating citrus fruits, such as oranges and grapefruit.  What do I need to know about this diet?  Eat a variety of foods from the bland diet food list.  Do not follow a bland diet longer than you have to.  Ask your health care provider whether you should take vitamins. What foods can I eat? Grains  Hot cereals, such as cream of wheat. Bread, crackers, or tortillas made from refined white flour. Rice. Vegetables Canned or cooked vegetables. Mashed or boiled potatoes. Fruits Bananas. Applesauce. Other types of cooked or canned fruit with the skin and seeds removed, such as canned peaches or pears. Meats and Other Protein Sources Scrambled eggs. Creamy peanut butter or other nut butters. Lean, well-cooked meats, such as chicken or fish. Tofu. Soups or broths. Dairy Low-fat dairy products, such as milk, cottage cheese, or yogurt. Beverages Water. Herbal tea. Apple juice. Sweets and Desserts Pudding. Custard. Fruit gelatin. Ice cream. Fats and Oils Mild salad dressings. Canola or olive oil. The items listed above may not be a complete list of allowed foods or beverages. Contact your dietitian for more options. What foods are not recommended? Foods and ingredients that are often not recommended include:  Spicy foods, such as hot sauce or  salsa.  Fried foods.  Sour foods, such as pickled or fermented foods.  Raw vegetables or fruits, especially citrus or berries.  Caffeinated drinks.  Alcohol.  Strongly flavored seasonings or condiments.  The items listed above may not be a complete list of foods and beverages that are not allowed. Contact your dietitian for more information. This information is not intended to replace advice given to you by your health care provider. Make sure you discuss any questions you have with your health care provider. Document Released: 04/28/2015 Document Revised: 06/12/2015 Document Reviewed: 01/16/2014 Elsevier Interactive Patient Education  2018 Elsevier Inc. Viral Gastroenteritis, Adult Viral gastroenteritis is also known as the stomach flu. This condition is caused by certain germs (viruses). These germs can be passed from person to person very easily (are very contagious). This condition can cause sudden watery poop (diarrhea), fever, and throwing up (vomiting). Having watery poop and throwing up can make you feel weak and cause you to get dehydrated. Dehydration can make you tired and thirsty, make you have a dry mouth, and make it so you pee (urinate) less often. Older adults and people with other diseases or a weak defense system (immune system) are at higher risk for dehydration. It is important to replace the fluids that you lose from having watery poop and throwing up. Follow these instructions at home: Follow instructions from your doctor about how to care for yourself at home. Eating and drinking  Follow these instructions as told by your doctor:  Take an oral rehydration solution (ORS). This is   a drink that is sold at pharmacies and stores.  Drink clear fluids in small amounts as you are able, such as: ? Water. ? Ice chips. ? Diluted fruit juice. ? Low-calorie sports drinks.  Eat bland, easy-to-digest foods in small amounts as you are able, such  as: ? Bananas. ? Applesauce. ? Rice. ? Low-fat (lean) meats. ? Toast. ? Crackers.  Avoid fluids that have a lot of sugar or caffeine in them.  Avoid alcohol.  Avoid spicy or fatty foods.  General instructions  Drink enough fluid to keep your pee (urine) clear or pale yellow.  Wash your hands often. If you cannot use soap and water, use hand sanitizer.  Make sure that all people in your home wash their hands well and often.  Rest at home while you get better.  Take over-the-counter and prescription medicines only as told by your doctor.  Watch your condition for any changes.  Take a warm bath to help with any burning or pain from having watery poop.  Keep all follow-up visits as told by your doctor. This is important. Contact a doctor if:  You cannot keep fluids down.  Your symptoms get worse.  You have new symptoms.  You feel light-headed or dizzy.  You have muscle cramps. Get help right away if:  You have chest pain.  You feel very weak or you pass out (faint).  You see blood in your throw-up.  Your throw-up looks like coffee grounds.  You have bloody or black poop (stools) or poop that look like tar.  You have a very bad headache, a stiff neck, or both.  You have a rash.  You have very bad pain, cramping, or bloating in your belly (abdomen).  You have trouble breathing.  You are breathing very quickly.  Your heart is beating very quickly.  Your skin feels cold and clammy.  You feel confused.  You have pain when you pee.  You have signs of dehydration, such as: ? Dark pee, hardly any pee, or no pee. ? Cracked lips. ? Dry mouth. ? Sunken eyes. ? Sleepiness. ? Weakness. This information is not intended to replace advice given to you by your health care provider. Make sure you discuss any questions you have with your health care provider. Document Released: 06/23/2007 Document Revised: 07/25/2015 Document Reviewed: 09/10/2014 Elsevier  Interactive Patient Education  2017 Elsevier Inc.  

## 2016-12-24 NOTE — MAU Provider Note (Signed)
History     CSN: [id number]  Arrival date and time: 12/24/16 1005   First Provider Initiated Contact with Patient 12/24/16 1049     Chief Complaint  Patient presents with  . Nausea  . Emesis  . Decreased Fetal Movement  . Diarrhea   HPI Jaime Frost is a 36 y.o. G1P0 at 3445w3d who presents with complaints of nausea, vomiting and diarrhea. She states the symptoms started last night and has has vomited once and had 4-5 episodes of diarrhea overnight. 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 She also reports feeling less fetal movement since the symptoms started. She denies any abdominal pain, vaginal bleeding or leaking of fluid. She denies any problems during this pregnancy.   OB History    Gravida Para Term Preterm AB Living   1             SAB TAB Ectopic Multiple Live Births                  Past Medical History:  Diagnosis Date  . Medical history non-contributory     Past Surgical History:  Procedure Laterality Date  . NO PAST SURGERIES      No family history on file.  Social History   Tobacco Use  . Smoking status: Never Smoker  . Smokeless tobacco: Never Used  Substance Use Topics  . Alcohol use: Yes    Comment: none since pregnancy  . Drug use: No   Allergies: No Known Allergies  Medications Prior to Admission  Medication Sig Dispense Refill Last Dose  . Prenatal Vit-Fe Fumarate-FA (PRENATAL MULTIVITAMIN) TABS tablet Take 1 tablet by mouth daily at 12 noon.   Past Week at Unknown time    Review of Systems  Constitutional: Negative.  Negative for fatigue and fever.  HENT: Negative.   Respiratory: Negative.  Negative for shortness of breath.   Cardiovascular: Negative.  Negative for chest pain.  Gastrointestinal: Positive for diarrhea, nausea and vomiting. Negative for abdominal pain and constipation.  Genitourinary: Negative.  Negative for dysuria.  Neurological: Negative.  Negative for dizziness and  headaches.   Physical Exam   Blood pressure 109/67, pulse 96, temperature 98.2 F (36.8 C), temperature source Oral, resp. rate 20, height 5\' 2"  (1.575 m), weight 154 lb 4 oz (70 kg), last menstrual period 05/11/2016, SpO2 100 %.  Physical Exam  Nursing note and vitals reviewed. Constitutional: She is oriented to person, place, and time. She appears well-developed and well-nourished. No distress.  HENT:  Head: Normocephalic.  Eyes: Pupils are equal, round, and reactive to light.  Cardiovascular: Normal rate, regular rhythm and normal heart sounds.  Respiratory: Effort normal and breath sounds normal. No respiratory distress.  GI: Soft. Bowel sounds are normal. She exhibits no distension. There is no tenderness.  Neurological: She is alert and oriented to person, place, and time.  Skin: Skin is warm and dry.  Psychiatric: She has a normal mood and affect. Her behavior is normal. Judgment and thought content normal.   Fetal Tracing:  Baseline: 130 Variability: moderate Accels: 15x15 Decels: none  Toco: none  MAU Course  Procedures Results for orders placed or performed during the hospital encounter of 12/24/16 (from the past 24 hour(s))  Urinalysis, Routine w reflex microscopic     Status: Abnormal   Collection Time: 12/24/16 10:25 AM  Result Value Ref Range   Color, Urine YELLOW YELLOW   APPearance HAZY (A) CLEAR  Specific Gravity, Urine 1.017 1.005 - 1.030   pH 6.0 5.0 - 8.0   Glucose, UA NEGATIVE NEGATIVE mg/dL   Hgb urine dipstick NEGATIVE NEGATIVE   Bilirubin Urine NEGATIVE NEGATIVE   Ketones, ur NEGATIVE NEGATIVE mg/dL   Protein, ur NEGATIVE NEGATIVE mg/dL   Nitrite NEGATIVE NEGATIVE   Leukocytes, UA TRACE (A) NEGATIVE   RBC / HPF 0-5 0 - 5 RBC/hpf   WBC, UA 0-5 0 - 5 WBC/hpf   Bacteria, UA NONE SEEN NONE SEEN   Squamous Epithelial / LPF 6-30 (A) NONE SEEN   Mucus PRESENT      MDM UA NST- reactive, patient reports feeling fetal movement while in MAU Zofran  8mg  ODT- patient driving self Reviewed with Dr. Tenny Crawoss- will d/c patient home with nausea medication and kick counts Assessment and Plan   1. Gastroenteritis   2. Nausea and vomiting during pregnancy   3. Decreased fetal movements in third trimester, single or unspecified fetus    -Discharge home in stable condition -Rx for zofran sent to patient's pharmacy -Fetal kick counts and BRAT diet precautions discussed -Patient advised to follow-up with [facility] as scheduled for prenatal care. -Patient may return to MAU as needed or if her condition were to change or worsen  Rolm BookbinderCaroline M Yoali Conry CNM 12/24/2016, 11:14 AM   Allergies as of 12/24/2016   No Known Allergies     Medication List    TAKE these medications   ondansetron 8 MG disintegrating tablet Commonly known as:  ZOFRAN ODT Take 1 tablet (8 mg total) by mouth every 8 (eight) hours as needed for nausea or vomiting.   prenatal multivitamin Tabs tablet Take 1 tablet by mouth daily at 12 noon.

## 2017-01-14 LAB — OB RESULTS CONSOLE GBS: STREP GROUP B AG: POSITIVE

## 2017-01-14 LAB — OB RESULTS CONSOLE GC/CHLAMYDIA
Chlamydia: NEGATIVE
GC PROBE AMP, GENITAL: NEGATIVE

## 2017-01-18 NOTE — L&D Delivery Note (Signed)
Delivery Note At 4:30 PM a viable healthy female was delivered via  (Presentation: Left occiput; Anterior ).  APGAR: 9, 9; weight pending .   Placenta status: spontaneous, intact.  Cord: 3V  Anesthesia:  Epidural Episiotomy:  None Lacerations:  Left vaginal Suture Repair: 3.0 vicryl Est. Blood Loss (mL):  200 cc Mom to postpartum.  Baby to Couplet care / Skin to Skin.  Waynard ReedsKendra Metta Koranda 02/04/2017, 4:49 PM

## 2017-02-03 ENCOUNTER — Encounter (HOSPITAL_COMMUNITY): Payer: Self-pay | Admitting: *Deleted

## 2017-02-03 ENCOUNTER — Other Ambulatory Visit: Payer: Self-pay

## 2017-02-03 ENCOUNTER — Inpatient Hospital Stay (HOSPITAL_COMMUNITY)
Admission: AD | Admit: 2017-02-03 | Discharge: 2017-02-06 | DRG: 807 | Disposition: A | Discharge status: Home / Self Care | Payer: Medicaid Other | Source: Ambulatory Visit | Attending: Obstetrics and Gynecology | Admitting: Obstetrics and Gynecology

## 2017-02-03 DIAGNOSIS — Z23 Encounter for immunization: Secondary | ICD-10-CM | POA: Diagnosis not present

## 2017-02-03 DIAGNOSIS — Z3A38 38 weeks gestation of pregnancy: Secondary | ICD-10-CM

## 2017-02-03 DIAGNOSIS — Z3483 Encounter for supervision of other normal pregnancy, third trimester: Secondary | ICD-10-CM | POA: Diagnosis present

## 2017-02-03 DIAGNOSIS — O99824 Streptococcus B carrier state complicating childbirth: Principal | ICD-10-CM | POA: Diagnosis present

## 2017-02-03 LAB — CBC
HEMATOCRIT: 38.2 % (ref 36.0–46.0)
HEMOGLOBIN: 13.1 g/dL (ref 12.0–15.0)
MCH: 33.2 pg (ref 26.0–34.0)
MCHC: 34.3 g/dL (ref 30.0–36.0)
MCV: 97 fL (ref 78.0–100.0)
Platelets: 199 10*3/uL (ref 150–400)
RBC: 3.94 MIL/uL (ref 3.87–5.11)
RDW: 13.2 % (ref 11.5–15.5)
WBC: 8.6 10*3/uL (ref 4.0–10.5)

## 2017-02-03 LAB — TYPE AND SCREEN
ABO/RH(D): O POS
ANTIBODY SCREEN: NEGATIVE

## 2017-02-03 LAB — POCT FERN TEST: POCT FERN TEST: POSITIVE

## 2017-02-03 MED ORDER — ONDANSETRON HCL 4 MG/2ML IJ SOLN
4.0000 mg | Freq: Four times a day (QID) | INTRAMUSCULAR | Status: DC | PRN
  Administered 2017-02-04: 4 mg via INTRAVENOUS
  Filled 2017-02-03 (×2): qty 2

## 2017-02-03 MED ORDER — SOD CITRATE-CITRIC ACID 500-334 MG/5ML PO SOLN: 30.0000 mL | ORAL | Status: DC | PRN

## 2017-02-03 MED ORDER — OXYTOCIN 40 UNITS IN LACTATED RINGERS INFUSION - SIMPLE MED
1.0000 m[IU]/min | INTRAVENOUS | Status: DC
  Administered 2017-02-03: 1 m[IU]/min via INTRAVENOUS
  Filled 2017-02-03: qty 1000

## 2017-02-03 MED ORDER — FLEET ENEMA 7-19 GM/118ML RE ENEM: 1.0000 | ENEMA | RECTAL | Status: DC | PRN

## 2017-02-03 MED ORDER — ACETAMINOPHEN 325 MG PO TABS: 650.0000 mg | ORAL_TABLET | ORAL | Status: DC | PRN

## 2017-02-03 MED ORDER — OXYCODONE-ACETAMINOPHEN 5-325 MG PO TABS: 1.0000 | ORAL_TABLET | ORAL | Status: DC | PRN

## 2017-02-03 MED ORDER — PENICILLIN G POT IN DEXTROSE 60000 UNIT/ML IV SOLN
3.0000 10*6.[IU] | INTRAVENOUS | Status: DC
  Administered 2017-02-03 – 2017-02-04 (×4): 3 10*6.[IU] via INTRAVENOUS
  Filled 2017-02-03 (×9): qty 50

## 2017-02-03 MED ORDER — OXYCODONE-ACETAMINOPHEN 5-325 MG PO TABS: 2.0000 | ORAL_TABLET | ORAL | Status: DC | PRN

## 2017-02-03 MED ORDER — LIDOCAINE HCL (PF) 1 % IJ SOLN
30.0000 mL | INTRAMUSCULAR | Status: AC | PRN
  Administered 2017-02-04: 30 mL via SUBCUTANEOUS
  Filled 2017-02-03: qty 30

## 2017-02-03 MED ORDER — LACTATED RINGERS IV SOLN: 500.0000 mL | INTRAVENOUS | Status: DC | PRN

## 2017-02-03 MED ORDER — OXYTOCIN BOLUS FROM INFUSION
500.0000 mL | Freq: Once | INTRAVENOUS | Status: AC
  Administered 2017-02-04: 500 mL via INTRAVENOUS

## 2017-02-03 MED ORDER — PENICILLIN G POTASSIUM 5000000 UNITS IJ SOLR
5.0000 10*6.[IU] | Freq: Once | INTRAVENOUS | Status: AC
  Administered 2017-02-03: 5 10*6.[IU] via INTRAVENOUS
  Filled 2017-02-03: qty 5

## 2017-02-03 MED ORDER — LACTATED RINGERS IV SOLN
INTRAVENOUS | Status: DC
  Administered 2017-02-03 – 2017-02-04 (×3): via INTRAVENOUS

## 2017-02-03 MED ORDER — OXYTOCIN 40 UNITS IN LACTATED RINGERS INFUSION - SIMPLE MED: 2.5000 [IU]/h | INTRAVENOUS | Status: DC

## 2017-02-03 MED ORDER — TERBUTALINE SULFATE 1 MG/ML IJ SOLN
0.2500 mg | Freq: Once | INTRAMUSCULAR | Status: DC | PRN
  Filled 2017-02-03: qty 1

## 2017-02-03 NOTE — MAU Note (Signed)
Pt presents to MAU c/o SROM @ 1830. Pt reports some irregular ctxs. Pt reports good FM. Pt report some bloody show and clear fluid.

## 2017-02-03 NOTE — Anesthesia Pain Management Evaluation Note (Signed)
  CRNA Pain Management Visit Note  Patient: Jaime Frost, 37 y.o., female  "Hello I am a member of the anesthesia team at [facility]. We have an anesthesia team available at all times to provide care throughout the hospital, including epidural management and anesthesia for C-section. I don't know your plan for the delivery whether it a natural birth, water birth, IV sedation, nitrous supplementation, doula or epidural, but we want to meet your pain goals."   1.Was your pain managed to your expectations on prior hospitalizations?   No prior hospitalizations  2.What is your expectation for pain management during this hospitalization?     Epidural, IV pain meds and Nitrous Oxide  3.How can we help you reach that goal? Prefers natural but will consider other options if  needed  Record the patient's initial score and the patient's pain goal.   Pain: 0  Pain Goal: 5 [facility] wants you to be able to say your pain was always managed very well.  [facility]ERRITT,Jaime Frost 02/03/2017

## 2017-02-04 ENCOUNTER — Inpatient Hospital Stay (HOSPITAL_COMMUNITY): Payer: Medicaid Other | Admitting: Anesthesiology

## 2017-02-04 ENCOUNTER — Encounter (HOSPITAL_COMMUNITY): Payer: Self-pay

## 2017-02-04 LAB — RPR: RPR Ser Ql: NONREACTIVE

## 2017-02-04 MED ORDER — OXYCODONE-ACETAMINOPHEN 5-325 MG PO TABS: 2.0000 | ORAL_TABLET | ORAL | Status: DC | PRN

## 2017-02-04 MED ORDER — EPHEDRINE 5 MG/ML INJ
10.0000 mg | INTRAVENOUS | Status: DC | PRN
Start: 2017-02-04 — End: 2017-02-04
  Filled 2017-02-04: qty 2

## 2017-02-04 MED ORDER — PHENYLEPHRINE 40 MCG/ML (10ML) SYRINGE FOR IV PUSH (FOR BLOOD PRESSURE SUPPORT)
80.0000 ug | PREFILLED_SYRINGE | INTRAVENOUS | Status: DC | PRN
  Administered 2017-02-04 (×2): 80 ug via INTRAVENOUS
  Filled 2017-02-04: qty 5

## 2017-02-04 MED ORDER — TERBUTALINE SULFATE 1 MG/ML IJ SOLN
0.2500 mg | Freq: Once | INTRAMUSCULAR | Status: DC | PRN
  Filled 2017-02-04: qty 1

## 2017-02-04 MED ORDER — METHYLERGONOVINE MALEATE 0.2 MG/ML IJ SOLN: 0.2000 mg | INTRAMUSCULAR | Status: DC | PRN

## 2017-02-04 MED ORDER — OXYTOCIN 40 UNITS IN LACTATED RINGERS INFUSION - SIMPLE MED
1.0000 m[IU]/min | INTRAVENOUS | Status: DC
  Administered 2017-02-04: 6 m[IU]/min via INTRAVENOUS
  Administered 2017-02-04: 5 m[IU]/min via INTRAVENOUS
  Filled 2017-02-04: qty 1000

## 2017-02-04 MED ORDER — DIBUCAINE 1 % RE OINT: 1.0000 "application " | TOPICAL_OINTMENT | RECTAL | Status: DC | PRN

## 2017-02-04 MED ORDER — INFLUENZA VAC SPLIT QUAD 0.5 ML IM SUSY
0.5000 mL | PREFILLED_SYRINGE | INTRAMUSCULAR | Status: DC
  Filled 2017-02-04: qty 0.5

## 2017-02-04 MED ORDER — EPHEDRINE 5 MG/ML INJ
10.0000 mg | INTRAVENOUS | Status: DC | PRN
  Filled 2017-02-04: qty 2

## 2017-02-04 MED ORDER — COCONUT OIL OIL
1.0000 "application " | TOPICAL_OIL | Status: DC | PRN
  Administered 2017-02-05: 1 via TOPICAL
  Filled 2017-02-04: qty 120

## 2017-02-04 MED ORDER — ZOLPIDEM TARTRATE 5 MG PO TABS: 5.0000 mg | ORAL_TABLET | Freq: Every evening | ORAL | Status: DC | PRN

## 2017-02-04 MED ORDER — SENNOSIDES-DOCUSATE SODIUM 8.6-50 MG PO TABS
2.0000 | ORAL_TABLET | ORAL | Status: DC
  Administered 2017-02-05: 2 via ORAL
  Filled 2017-02-04 (×2): qty 2

## 2017-02-04 MED ORDER — ONDANSETRON HCL 4 MG/2ML IJ SOLN: 4.0000 mg | INTRAMUSCULAR | Status: DC | PRN

## 2017-02-04 MED ORDER — FENTANYL 2.5 MCG/ML BUPIVACAINE 1/10 % EPIDURAL INFUSION (WH - ANES)
14.0000 mL/h | INTRAMUSCULAR | Status: DC | PRN
  Administered 2017-02-04: 12 mL/h via EPIDURAL
  Administered 2017-02-04: 14 mL/h via EPIDURAL
  Filled 2017-02-04: qty 100

## 2017-02-04 MED ORDER — LIDOCAINE HCL (PF) 1 % IJ SOLN
INTRAMUSCULAR | Status: DC | PRN
  Administered 2017-02-04: 5 mL via EPIDURAL
  Administered 2017-02-04: 3 mL via EPIDURAL
  Administered 2017-02-04: 2 mL via EPIDURAL

## 2017-02-04 MED ORDER — PHENYLEPHRINE 40 MCG/ML (10ML) SYRINGE FOR IV PUSH (FOR BLOOD PRESSURE SUPPORT)
PREFILLED_SYRINGE | INTRAVENOUS | Status: AC
  Administered 2017-02-04: 80 ug via INTRAVENOUS
  Filled 2017-02-04: qty 20

## 2017-02-04 MED ORDER — SIMETHICONE 80 MG PO CHEW
80.0000 mg | CHEWABLE_TABLET | ORAL | Status: DC | PRN
Start: 2017-02-04 — End: 2017-02-06

## 2017-02-04 MED ORDER — PRENATAL MULTIVITAMIN CH
1.0000 | ORAL_TABLET | Freq: Every day | ORAL | Status: DC
  Administered 2017-02-05 – 2017-02-06 (×2): 1 via ORAL
  Filled 2017-02-04: qty 1

## 2017-02-04 MED ORDER — METHYLERGONOVINE MALEATE 0.2 MG PO TABS: 0.2000 mg | ORAL_TABLET | ORAL | Status: DC | PRN

## 2017-02-04 MED ORDER — LACTATED RINGERS IV SOLN: 500.0000 mL | Freq: Once | INTRAVENOUS | Status: DC

## 2017-02-04 MED ORDER — DIPHENHYDRAMINE HCL 50 MG/ML IJ SOLN: 12.5000 mg | INTRAMUSCULAR | Status: DC | PRN

## 2017-02-04 MED ORDER — ONDANSETRON HCL 4 MG PO TABS: 4.0000 mg | ORAL_TABLET | ORAL | Status: DC | PRN

## 2017-02-04 MED ORDER — BENZOCAINE-MENTHOL 20-0.5 % EX AERO
1.0000 "application " | INHALATION_SPRAY | CUTANEOUS | Status: DC | PRN
  Administered 2017-02-05: 1 via TOPICAL
  Filled 2017-02-04: qty 56

## 2017-02-04 MED ORDER — FENTANYL CITRATE (PF) 100 MCG/2ML IJ SOLN
100.0000 ug | Freq: Once | INTRAMUSCULAR | Status: AC
  Administered 2017-02-04: 100 ug via INTRAVENOUS
  Filled 2017-02-04: qty 2

## 2017-02-04 MED ORDER — DIPHENHYDRAMINE HCL 25 MG PO CAPS: 25.0000 mg | ORAL_CAPSULE | Freq: Four times a day (QID) | ORAL | Status: DC | PRN

## 2017-02-04 MED ORDER — TETANUS-DIPHTH-ACELL PERTUSSIS 5-2.5-18.5 LF-MCG/0.5 IM SUSP: 0.5000 mL | Freq: Once | INTRAMUSCULAR | Status: DC

## 2017-02-04 MED ORDER — LACTATED RINGERS IV SOLN
500.0000 mL | Freq: Once | INTRAVENOUS | Status: AC
  Administered 2017-02-04: 500 mL via INTRAVENOUS

## 2017-02-04 MED ORDER — OXYCODONE-ACETAMINOPHEN 5-325 MG PO TABS: 1.0000 | ORAL_TABLET | ORAL | Status: DC | PRN

## 2017-02-04 MED ORDER — WITCH HAZEL-GLYCERIN EX PADS: 1.0000 "application " | MEDICATED_PAD | CUTANEOUS | Status: DC | PRN

## 2017-02-04 MED ORDER — FENTANYL 2.5 MCG/ML BUPIVACAINE 1/10 % EPIDURAL INFUSION (WH - ANES)
INTRAMUSCULAR | Status: AC
  Filled 2017-02-04: qty 100

## 2017-02-04 MED ORDER — ACETAMINOPHEN 325 MG PO TABS: 650.0000 mg | ORAL_TABLET | ORAL | Status: DC | PRN

## 2017-02-04 MED ORDER — IBUPROFEN 600 MG PO TABS
600.0000 mg | ORAL_TABLET | Freq: Four times a day (QID) | ORAL | Status: DC
  Administered 2017-02-04 – 2017-02-06 (×7): 600 mg via ORAL
  Filled 2017-02-04 (×6): qty 1

## 2017-02-04 NOTE — Anesthesia Preprocedure Evaluation (Signed)
Anesthesia Evaluation  Patient identified by MRN, date of birth, ID band Patient awake    Reviewed: Allergy & Precautions, NPO status , Patient's Chart, lab work & pertinent test results  Airway Mallampati: II  TM Distance: >3 FB Neck ROM: Full    Dental  (+) Teeth Intact, Dental Advisory Given   Pulmonary neg pulmonary ROS,    Pulmonary exam normal breath sounds clear to auscultation       Cardiovascular negative cardio ROS Normal cardiovascular exam Rhythm:Regular Rate:Normal     Neuro/Psych negative neurological ROS     GI/Hepatic negative GI ROS, Neg liver ROS,   Endo/Other  negative endocrine ROSObesity   Renal/GU negative Renal ROS     Musculoskeletal negative musculoskeletal ROS (+)   Abdominal   Peds  Hematology Plt 199k   Anesthesia Other Findings Day of surgery medications reviewed with the patient.  Reproductive/Obstetrics (+) Pregnancy                             Anesthesia Physical Anesthesia Plan  ASA: II  Anesthesia Plan: Epidural   Post-op Pain Management:    Induction:   PONV Risk Score and Plan: 2 and Treatment may vary due to age or medical condition  Airway Management Planned:   Additional Equipment:   Intra-op Plan:   Post-operative Plan:   Informed Consent: I have reviewed the patients History and Physical, chart, labs and discussed the procedure including the risks, benefits and alternatives for the proposed anesthesia with the patient or authorized representative who has indicated his/her understanding and acceptance.   Dental advisory given  Plan Discussed with:   Anesthesia Plan Comments: (Patient identified. Risks/Benefits/Options discussed with patient including but not limited to bleeding, infection, nerve damage, paralysis, failed block, incomplete pain control, headache, blood pressure changes, nausea, vomiting, reactions to medication both  or allergic, itching and postpartum back pain. Confirmed with bedside nurse the patient's most recent platelet count. Confirmed with patient that they are not currently taking any anticoagulation, have any bleeding history or any family history of bleeding disorders. Patient expressed understanding and wished to proceed. All questions were answered. )        Anesthesia Quick Evaluation

## 2017-02-04 NOTE — Anesthesia Procedure Notes (Signed)
Epidural Patient location during procedure: OB Start time: 02/04/2017 6:57 AM End time: 02/04/2017 7:02 AM  Staffing Anesthesiologist: Cecile Hearingurk, Stephen Edward, MD Performed: anesthesiologist   Preanesthetic Checklist Completed: patient identified, pre-op evaluation, timeout performed, IV checked, risks and benefits discussed and monitors and equipment checked  Epidural Patient position: sitting Prep: DuraPrep Patient monitoring: blood pressure and continuous pulse ox Approach: midline Location: L3-L4 Injection technique: LOR air  Needle:  Needle type: Tuohy  Needle gauge: 17 G Needle length: 9 cm Needle insertion depth: 6 cm Catheter size: 19 Gauge Catheter at skin depth: 11 cm Test dose: negative and Other (1% Lidocaine)  Additional Notes Patient identified.  Risk benefits discussed including failed block, incomplete pain control, headache, nerve damage, paralysis, blood pressure changes, nausea, vomiting, reactions to medication both toxic or allergic, and postpartum back pain.  Patient expressed understanding and wished to proceed.  All questions were answered.  Sterile technique used throughout procedure and epidural site dressed with sterile barrier dressing. No paresthesia or other complications noted. The patient did not experience any signs of intravascular injection such as tinnitus or metallic taste in mouth nor signs of intrathecal spread such as rapid motor block. Please see nursing notes for vital signs. Reason for block:procedure for pain

## 2017-02-04 NOTE — H&P (Signed)
Jaime AblesLatoya Frost is a 37 y.o. female presenting for leaking fluid  37 yo G1P0 @ 38+3 presents for leaking of fluid. Her [pregnancy has been complicated by AMA OB History    Gravida Para Term Preterm AB Living   1             SAB TAB Ectopic Multiple Live Births                 Past Medical History:  Diagnosis Date  . Medical history non-contributory    Past Surgical History:  Procedure Laterality Date  . NO PAST SURGERIES     Family History: family history includes Diabetes in her mother; Heart disease in her maternal grandfather and maternal grandmother; Hypertension in her mother. Social History:  reports that  has never smoked. she has never used smokeless tobacco. She reports that she drinks alcohol. She reports that she does not use drugs.     Maternal Diabetes: No Genetic Screening: Declined Maternal Ultrasounds/Referrals: Normal Fetal Ultrasounds or other Referrals:  None Maternal Substance Abuse:  No Significant Maternal Medications:  None Significant Maternal Lab Results:  None Other Comments:  None  ROS History Dilation: 10 Effacement (%): 100 Station: 0, +1 Exam by:: Jaime CarsonLindsay Lima, rn Blood pressure 114/62, pulse 90, temperature 98.3 F (36.8 C), temperature source Oral, resp. rate 16, height 5\' 2"  (1.575 m), weight 77.1 kg (170 lb), last menstrual period 05/11/2016, SpO2 100 %. Exam Physical Exam  Prenatal labs: ABO, Rh: --/--/O POS (01/17 2015) Antibody: NEG (01/17 2015) Rubella: Immune (07/18 0000) RPR: Non Reactive (01/17 2015)  HBsAg: Negative (07/18 0000)  HIV: Non Reactive (05/31 1748)  GBS: Positive (12/28 0000)   Assessment/Plan: 1) Admit 2) PCN for GBS 3) Anticipate SVD   Jaime ReedsKendra Taysom Frost 02/04/2017, 4:51 PM

## 2017-02-04 NOTE — Anesthesia Postprocedure Evaluation (Signed)
Anesthesia Post Note  Patient: Scientist, forensicLatoya Flott  Procedure(s) Performed: AN AD HOC LABOR EPIDURAL     Patient location during evaluation: Mother Baby Anesthesia Type: Epidural Level of consciousness: awake and alert and oriented Pain management: satisfactory to patient Vital Signs Assessment: post-procedure vital signs reviewed and stable Respiratory status: respiratory function stable Cardiovascular status: stable Postop Assessment: no headache, no backache, epidural receding, patient able to bend at knees, no signs of nausea or vomiting and adequate PO intake Anesthetic complications: no    Last Vitals:  Vitals:   02/04/17 1818 02/04/17 1825  BP: 99/74   Pulse: (!) 103   Resp:  18  Temp:    SpO2:      Last Pain:  Vitals:   02/04/17 1701  TempSrc: Axillary  PainSc:    Pain Goal:                 Percy Comp

## 2017-02-05 LAB — CBC
HCT: 33.6 % — ABNORMAL LOW (ref 36.0–46.0)
Hemoglobin: 11.6 g/dL — ABNORMAL LOW (ref 12.0–15.0)
MCH: 33.5 pg (ref 26.0–34.0)
MCHC: 34.5 g/dL (ref 30.0–36.0)
MCV: 97.1 fL (ref 78.0–100.0)
PLATELETS: 160 10*3/uL (ref 150–400)
RBC: 3.46 MIL/uL — AB (ref 3.87–5.11)
RDW: 13.6 % (ref 11.5–15.5)
WBC: 15.4 10*3/uL — AB (ref 4.0–10.5)

## 2017-02-05 NOTE — Lactation Note (Signed)
This note was copied from a baby's chart. Lactation Consultation Note  Patient Name: Boy Margot AblesLatoya Graveline NWGNF'AToday's Date: 02/05/2017   P1, < 6 lbs baby 1719 hours old and latched upon entering in cradle hold. Sucks and swallows observed w/ lips flanged. Baby came off after 30 min of breastfeeding. Reviewed hand expression with drops expressed on spoon and given to baby. Mother's L nipple is sore with abrasion on tip. Provided her with coconut oil and recommend ebm. Helped re-latch baby and reviewed basics. Mom encouraged to feed baby 8-12 times/24 hours and with feeding cues q 3 hours. Mom made aware of O/P services, breastfeeding support groups, community resources, and our phone # for post-discharge questions.        Maternal Data    Feeding Feeding Type: Breast Fed  LATCH Score Latch: Grasps breast easily, tongue down, lips flanged, rhythmical sucking.  Audible Swallowing: Spontaneous and intermittent  Type of Nipple: Everted at rest and after stimulation  Comfort (Breast/Nipple): Soft / non-tender  Hold (Positioning): Assistance needed to correctly position infant at breast and maintain latch.  LATCH Score: 9  Interventions    Lactation Tools Discussed/Used     Consult Status      Dahlia ByesBerkelhammer, Ruth [facility] 02/05/2017, 11:44 AM

## 2017-02-05 NOTE — Progress Notes (Signed)
Post Partum Day 1 Subjective: no complaints, up ad lib, voiding and tolerating PO  Objective: Blood pressure 112/62, pulse 75, temperature 98.1 F (36.7 C), temperature source Axillary, resp. rate 18, height 5\' 2"  (1.575 m), weight 77.1 kg (170 lb), last menstrual period 05/11/2016, SpO2 100 %, unknown if currently breastfeeding.  Physical Exam:  General: alert, cooperative and appears stated age Lochia: appropriate Uterine Fundus: firm   Recent Labs    02/03/17 2015 02/05/17 0511  HGB 13.1 11.6*  HCT 38.2 33.6*    Assessment/Plan: Plan for discharge tomorrow and Breastfeeding   LOS: 2 days   Waynard ReedsKendra Abi Shoults 02/05/2017, 10:35 AM

## 2017-02-06 MED ORDER — DOCUSATE SODIUM 100 MG PO CAPS: 100.0000 mg | ORAL_CAPSULE | Freq: Two times a day (BID) | ORAL | 0 refills | Status: DC

## 2017-02-06 MED ORDER — IBUPROFEN 600 MG PO TABS: 600.0000 mg | ORAL_TABLET | Freq: Four times a day (QID) | ORAL | 0 refills | Status: DC | PRN

## 2017-02-06 MED ORDER — OXYCODONE-ACETAMINOPHEN 5-325 MG PO TABS: 1.0000 | ORAL_TABLET | Freq: Four times a day (QID) | ORAL | 0 refills | Status: DC | PRN

## 2017-02-06 MED ORDER — INFLUENZA VAC SPLIT QUAD 0.5 ML IM SUSY
0.5000 mL | PREFILLED_SYRINGE | INTRAMUSCULAR | Status: AC
  Administered 2017-02-06: 0.5 mL via INTRAMUSCULAR

## 2017-02-06 NOTE — Discharge Summary (Signed)
Obstetric Discharge Summary Reason for Admission: onset of labor Prenatal Procedures: ultrasound Intrapartum Procedures: spontaneous vaginal delivery Postpartum Procedures: none Complications-Operative and Postpartum: vaginal laceration Hemoglobin  Date Value Ref Range Status  02/05/2017 11.6 (L) 12.0 - 15.0 g/dL Final   HCT  Date Value Ref Range Status  02/05/2017 33.6 (L) 36.0 - 46.0 % Final    Physical Exam:  General: alert, cooperative and appears stated age 41Lochia: appropriate Uterine Fundus: firm  Discharge Diagnoses: Term Pregnancy-delivered  Discharge Information: Date: 02/06/2017 Activity: pelvic rest Diet: routine Medications: Ibuprofen, Colace and Percocet Condition: improved Instructions: refer to practice specific booklet Discharge to: home Follow-up Information    Jaime Frost, Indy Prestwood, MD Follow up.   Specialty:  Obstetrics and Gynecology Contact information: [address] [place] [address] [phone]           Newborn Data: Live born female  Birth Weight: 5 lb 9.4 oz (2534 g) APGAR: 9, 9  Newborn Delivery   Birth date/time:  02/04/2017 16:30:00 Delivery type:  Vaginal, Spontaneous     Home with mother.  Jaime ReedsKendra Ashlyne Frost 02/06/2017, 10:46 AM

## 2017-02-06 NOTE — Lactation Note (Signed)
This note was copied from a baby's chart. Lactation Consultation Note  Patient Name: Jaime Margot AblesLatoya Melnyk ZOXWR'UToday's Date: 02/06/2017 Reason for consult: Follow-up assessment   P1, Baby 40 hours old.  < 6 lbs. Mother is complaining of nipple soreness. Observed latch and encouraged her to support baby with pillows. Sucks and swallows observed.  Baby is vigorous at the breast. Mother has coconut oil.  Provided her with comfort gels to wear w/ bra. Attempted latching with #20NS for soreness but mother states it did not help. Recommend mother post pump 4-6 times per day for 10-20 min with DEBP on initiation setting. Give baby back volume pumped at the next feeding. Reviewed cleaning and milk storage.  Discussed finger syringe feeding.  Mother has been spoon feeding. Suggest calling for help when she would like to finger syringe feed. Mom encouraged to feed baby 8-12 times/24 hours and with feeding cues at least q 3 hours.      Maternal Data    Feeding Feeding Type: Breast Fed Length of feed: 10 min  LATCH Score Latch: Grasps breast easily, tongue down, lips flanged, rhythmical sucking.  Audible Swallowing: A few with stimulation  Type of Nipple: Everted at rest and after stimulation  Comfort (Breast/Nipple): Filling, red/small blisters or bruises, mild/mod discomfort  Hold (Positioning): No assistance needed to correctly position infant at breast.  LATCH Score: 8  Interventions Interventions: Assisted with latch;Breast compression;Support pillows;Coconut oil;Comfort gels;DEBP;Hand pump  Lactation Tools Discussed/Used Pump Review: Setup, frequency, and cleaning;Milk Storage Initiated by:: Dahlia Byesuth Sarahanne Novakowski RN IBCLC Date initiated:: 02/06/17   Consult Status Consult Status: Follow-up Date: 02/07/17 Follow-up type: In-patient    Dahlia ByesBerkelhammer, Itai Barbian [facility] 02/06/2017, 9:21 AM

## 2017-08-12 ENCOUNTER — Inpatient Hospital Stay (HOSPITAL_COMMUNITY)
Admission: AD | Admit: 2017-08-12 | Discharge: 2017-08-12 | Disposition: A | Discharge status: Home / Self Care | Payer: Medicaid Other | Source: Ambulatory Visit | Attending: Obstetrics and Gynecology | Admitting: Obstetrics and Gynecology

## 2017-08-12 ENCOUNTER — Encounter (HOSPITAL_COMMUNITY): Payer: Self-pay | Admitting: *Deleted

## 2017-08-12 DIAGNOSIS — R1032 Left lower quadrant pain: Secondary | ICD-10-CM | POA: Insufficient documentation

## 2017-08-12 DIAGNOSIS — Z87440 Personal history of urinary (tract) infections: Secondary | ICD-10-CM | POA: Insufficient documentation

## 2017-08-12 DIAGNOSIS — Z833 Family history of diabetes mellitus: Secondary | ICD-10-CM | POA: Insufficient documentation

## 2017-08-12 DIAGNOSIS — K5900 Constipation, unspecified: Secondary | ICD-10-CM | POA: Insufficient documentation

## 2017-08-12 DIAGNOSIS — R300 Dysuria: Secondary | ICD-10-CM | POA: Insufficient documentation

## 2017-08-12 DIAGNOSIS — Z8249 Family history of ischemic heart disease and other diseases of the circulatory system: Secondary | ICD-10-CM | POA: Insufficient documentation

## 2017-08-12 HISTORY — DX: Chlamydial infection, unspecified: A74.9

## 2017-08-12 HISTORY — DX: Urinary tract infection, site not specified: N39.0

## 2017-08-12 LAB — WET PREP, GENITAL
Clue Cells Wet Prep HPF POC: NONE SEEN
Sperm: NONE SEEN
TRICH WET PREP: NONE SEEN
Yeast Wet Prep HPF POC: NONE SEEN

## 2017-08-12 LAB — POCT PREGNANCY, URINE: Preg Test, Ur: NEGATIVE

## 2017-08-12 LAB — URINALYSIS, ROUTINE W REFLEX MICROSCOPIC
BILIRUBIN URINE: NEGATIVE
Bacteria, UA: NONE SEEN
Glucose, UA: NEGATIVE mg/dL
KETONES UR: NEGATIVE mg/dL
LEUKOCYTES UA: NEGATIVE
Nitrite: NEGATIVE
PROTEIN: NEGATIVE mg/dL
Specific Gravity, Urine: 1.02 (ref 1.005–1.030)
pH: 5 (ref 5.0–8.0)

## 2017-08-12 MED ORDER — BISACODYL 5 MG PO TBEC: 5.0000 mg | DELAYED_RELEASE_TABLET | Freq: Two times a day (BID) | ORAL | 0 refills | Status: DC

## 2017-08-12 MED ORDER — DOCUSATE SODIUM 100 MG PO CAPS: 100.0000 mg | ORAL_CAPSULE | Freq: Two times a day (BID) | ORAL | 2 refills | Status: DC | PRN

## 2017-08-12 NOTE — Discharge Instructions (Signed)
High-Fiber Diet Fiber, also called dietary fiber, is a type of carbohydrate found in fruits, vegetables, whole grains, and beans. A high-fiber diet can have many health benefits. Your health care provider may recommend a high-fiber diet to help: Prevent constipation. Fiber can make your bowel movements more regular. Lower your cholesterol. Relieve hemorrhoids, uncomplicated diverticulosis, or irritable bowel syndrome. Prevent overeating as part of a weight-loss plan. Prevent heart disease, type 2 diabetes, and certain cancers.  What is my plan? The recommended daily intake of fiber includes: 38 grams for men under age 40. 30 grams for men over age 39. 25 grams for women under age 22. 21 grams for women over age 14.  You can get the recommended daily intake of dietary fiber by eating a variety of fruits, vegetables, grains, and beans. Your health care provider may also recommend a fiber supplement if it is not possible to get enough fiber through your diet. What do I need to know about a high-fiber diet? Fiber supplements have not been widely studied for their effectiveness, so it is better to get fiber through food sources. Always check the fiber content on thenutrition facts label of any prepackaged food. Look for foods that contain at least 5 grams of fiber per serving. Ask your dietitian if you have questions about specific foods that are related to your condition, especially if those foods are not listed in the following section. Increase your daily fiber consumption gradually. Increasing your intake of dietary fiber too quickly may cause bloating, cramping, or gas. Drink plenty of water. Water helps you to digest fiber. What foods can I eat? Grains Whole-grain breads. Multigrain cereal. Oats and oatmeal. Brown rice. Barley. Bulgur wheat. Millet. Bran muffins. Popcorn. Rye wafer crackers. Vegetables Sweet potatoes. Spinach. Kale. Artichokes. Cabbage. Broccoli. Green peas. Carrots.  Squash. Fruits Berries. Pears. Apples. Oranges. Avocados. Prunes and raisins. Dried figs. Meats and Other Protein Sources Navy, kidney, pinto, and soy beans. Split peas. Lentils. Nuts and seeds. Dairy Fiber-fortified yogurt. Beverages Fiber-fortified soy milk. Fiber-fortified orange juice. Other Fiber bars. The items listed above may not be a complete list of recommended foods or beverages. Contact your dietitian for more options. What foods are not recommended? Grains White bread. Pasta made with refined flour. White rice. Vegetables Fried potatoes. Canned vegetables. Well-cooked vegetables. Fruits Fruit juice. Cooked, strained fruit. Meats and Other Protein Sources Fatty cuts of meat. Fried Environmental education officer or fried fish. Dairy Milk. Yogurt. Cream cheese. Sour cream. Beverages Soft drinks. Other Cakes and pastries. Butter and oils. The items listed above may not be a complete list of foods and beverages to avoid. Contact your dietitian for more information. What are some tips for including high-fiber foods in my diet? Eat a wide variety of high-fiber foods. Make sure that half of all grains consumed each day are whole grains. Replace breads and cereals made from refined flour or white flour with whole-grain breads and cereals. Replace white rice with brown rice, bulgur wheat, or millet. Start the day with a breakfast that is high in fiber, such as a cereal that contains at least 5 grams of fiber per serving. Use beans in place of meat in soups, salads, or pasta. Eat high-fiber snacks, such as berries, raw vegetables, nuts, or popcorn. This information is not intended to replace advice given to you by your health care provider. Make sure you discuss any questions you have with your health care provider. Document Released: 01/04/2005 Document Revised: 06/12/2015 Document Reviewed: 06/19/2013 Elsevier Interactive Patient Education  2018 Elsevier Inc. Abdominal Pain, Adult Abdominal  pain can be caused by many things. Often, abdominal pain is not serious and it gets better with no treatment or by being treated at home. However, sometimes abdominal pain is serious. Your health care provider will do a medical history and a physical exam to try to determine the cause of your abdominal pain. Follow these instructions at home:  Take over-the-counter and prescription medicines only as told by your health care provider. Do not take a laxative unless told by your health care provider.  Drink enough fluid to keep your urine clear or pale yellow.  Watch your condition for any changes.  Keep all follow-up visits as told by your health care provider. This is important. Contact a health care provider if:  Your abdominal pain changes or gets worse.  You are not hungry or you lose weight without trying.  You are constipated or have diarrhea for more than 2-3 days.  You have pain when you urinate or have a bowel movement.  Your abdominal pain wakes you up at night.  Your pain gets worse with meals, after eating, or with certain foods.  You are throwing up and cannot keep anything down.  You have a fever. Get help right away if:  Your pain does not go away as soon as your health care provider told you to expect.  You cannot stop throwing up.  Your pain is only in areas of the abdomen, such as the right side or the left lower portion of the abdomen.  You have bloody or black stools, or stools that look like tar.  You have severe pain, cramping, or bloating in your abdomen.  You have signs of dehydration, such as: ? Dark urine, very little urine, or no urine. ? Cracked lips. ? Dry mouth. ? Sunken eyes. ? Sleepiness. ? Weakness. This information is not intended to replace advice given to you by your health care provider. Make sure you discuss any questions you have with your health care provider. Document Released: 10/14/2004 Document Revised: 07/25/2015 Document  Reviewed: 06/18/2015 Elsevier Interactive Patient Education  2018 [employer]  Constipation, Adult Constipation is when a person has fewer bowel movements in a week than normal, has difficulty having a bowel movement, or has stools that are dry, hard, or larger than normal. Constipation may be caused by an underlying condition. It may become worse with age if a person takes certain medicines and does not take in enough fluids. Follow these instructions at home: Eating and drinking   Eat foods that have a lot of fiber, such as fresh fruits and vegetables, whole grains, and beans.  Limit foods that are high in fat, low in fiber, or overly processed, such as french fries, hamburgers, cookies, candies, and soda.  Drink enough fluid to keep your urine clear or pale yellow. General instructions  Exercise regularly or as told by your health care provider.  Go to the restroom when you have the urge to go. Do not hold it in.  Take over-the-counter and prescription medicines only as told by your health care provider. These include any fiber supplements.  Practice pelvic floor retraining exercises, such as deep breathing while relaxing the lower abdomen and pelvic floor relaxation during bowel movements.  Watch your condition for any changes.  Keep all follow-up visits as told by your health care provider. This is important. Contact a health care provider if:  You have pain that gets worse.  You have  a fever.  You do not have a bowel movement after 4 days.  You vomit.  You are not hungry.  You lose weight.  You are bleeding from the anus.  You have thin, pencil-like stools. Get help right away if:  You have a fever and your symptoms suddenly get worse.  You leak stool or have blood in your stool.  Your abdomen is bloated.  You have severe pain in your abdomen.  You feel dizzy or you faint. This information is not intended to replace advice given to you by your health  care provider. Make sure you discuss any questions you have with your health care provider. Document Released: 10/03/2003 Document Revised: 07/25/2015 Document Reviewed: 06/25/2015 Elsevier Interactive Patient Education  2018 [employer].

## 2017-08-12 NOTE — MAU Note (Signed)
Pt reports abd swelling for the last 3 months, vaginal irritation and itching for a few days. Frequent urination, dysuria.

## 2017-08-12 NOTE — MAU Provider Note (Signed)
History     CSN: [id number]  Arrival date and time: 08/12/17 1201   First Provider Initiated Contact with Patient 08/12/17 1758      Chief Complaint  Patient presents with  . Dysuria  . Vaginal Itching  . Abdominal Pain   Jaime Frost is a 37 y.o. G1P1001 presenting with chronic LLQ abdominal discomfort that is intermittent and mild. Sometimes it is sharp and fleeting and other times it feels tender only when she pushes down on her lower abdomen. It has been present since her delivery Jan 2019. It does not radiate. No home analgesic treatmentShe has been constipated since then as well. Last BM was small 3 days ago. She uses OTC laxatives occasionally. Declines Fleets now.  She has vaginal itching, no malodor or vaginal bleeding. She uses condoms for contraception.  She has recently had some urinary burning and recently changed soaps. Denies frequency or urgency of urination. No hematuria or back pain.     OB History  Gravida Para Term Preterm AB Living  1 1 1     1   SAB TAB Ectopic Multiple Live Births        0 1    # Outcome Date GA Lbr Len/2nd Weight Sex Delivery Anes PTL Lv  1 Term 02/04/17 [redacted]w[redacted]d 08:42 / 00:48 5 lb 9.4 oz (2.534 kg) M Vag-Spont EPI  LIV     Past Medical History:  Diagnosis Date  . Chlamydia   . UTI (urinary tract infection)     Past Surgical History:  Procedure Laterality Date  . NO PAST SURGERIES      Family History  Problem Relation Age of Onset  . Diabetes Mother   . Hypertension Mother   . Heart disease Maternal Grandmother   . Heart disease Maternal Grandfather     Social History   Tobacco Use  . Smoking status: Never Smoker  . Smokeless tobacco: Never Used  Substance Use Topics  . Alcohol use: Yes    Comment: none since pregnancy  . Drug use: No    Allergies: No Known Allergies  Medications Prior to Admission  Medication Sig Dispense Refill Last Dose  . FIBER ADULT GUMMIES 2 g CHEW Chew 1 tablet by mouth daily as needed  (constipation).   Past Week at Unknown time  . ibuprofen (ADVIL,MOTRIN) 600 MG tablet Take 1 tablet (600 mg total) by mouth every 6 (six) hours as needed. 90 tablet 0 Past Month at Unknown time  . docusate sodium (COLACE) 100 MG capsule Take 1 capsule (100 mg total) by mouth 2 (two) times daily. (Patient not taking: Reported on 08/12/2017) 60 capsule 0 Not Taking at Unknown time  . oxyCODONE-acetaminophen (PERCOCET/ROXICET) 5-325 MG tablet Take 1 tablet by mouth every 6 (six) hours as needed for severe pain. (Patient not taking: Reported on 08/12/2017) 10 tablet 0 Not Taking at Unknown time    Review of Systems  Constitutional: Negative for chills, fatigue and fever.  HENT: Negative for congestion, postnasal drip, sinus pain and sore throat.   Respiratory: Positive for cough. Negative for chest tightness, shortness of breath and wheezing.        Yesterday had cough, now resolved  Gastrointestinal: Positive for abdominal pain.  Genitourinary: Positive for dysuria. Negative for decreased urine volume, flank pain, frequency, hematuria, pelvic pain, urgency, vaginal bleeding and vaginal discharge.  Musculoskeletal: Negative for back pain and myalgias.  Neurological: Negative for dizziness and headaches.  Psychiatric/Behavioral: The patient is not nervous/anxious.    Physical  Exam   Blood pressure 94/62, pulse 84, temperature 98.5 F (36.9 C), temperature source Oral, resp. rate 16, height 5' 3.75" (1.619 m), weight 146 lb 0.8 oz (66.2 kg), last menstrual period 08/02/2017, SpO2 99 %, unknown if currently breastfeeding.  Physical Exam  Nursing note and vitals reviewed. Constitutional: She is oriented to person, place, and time. She appears well-developed and well-nourished. No distress.  HENT:  Head: Normocephalic.  Eyes: Left eye exhibits no discharge.  Neck: Neck supple.  Cardiovascular: Normal rate.  Respiratory: Effort normal.  GI: Soft. There is no tenderness.  Genitourinary: Vagina  normal. No vaginal discharge found.  Musculoskeletal: She exhibits no tenderness.  Neurological: She is alert and oriented to person, place, and time.  Skin: Skin is warm and dry.  Psychiatric: She has a normal mood and affect.    MAU Course  Procedures Results for orders placed or performed during the hospital encounter of 08/12/17 (from the past 24 hour(s))  Urinalysis, Routine w reflex microscopic     Status: Abnormal   Collection Time: 08/12/17  1:05 PM  Result Value Ref Range   Color, Urine YELLOW YELLOW   APPearance CLEAR CLEAR   Specific Gravity, Urine 1.020 1.005 - 1.030   pH 5.0 5.0 - 8.0   Glucose, UA NEGATIVE NEGATIVE mg/dL   Hgb urine dipstick SMALL (A) NEGATIVE   Bilirubin Urine NEGATIVE NEGATIVE   Ketones, ur NEGATIVE NEGATIVE mg/dL   Protein, ur NEGATIVE NEGATIVE mg/dL   Nitrite NEGATIVE NEGATIVE   Leukocytes, UA NEGATIVE NEGATIVE   RBC / HPF 0-5 0 - 5 RBC/hpf   WBC, UA 0-5 0 - 5 WBC/hpf   Bacteria, UA NONE SEEN NONE SEEN   Squamous Epithelial / LPF 0-5 0 - 5   Mucus PRESENT   Pregnancy, urine POC     Status: None   Collection Time: 08/12/17  1:08 PM  Result Value Ref Range   Preg Test, Ur NEGATIVE NEGATIVE  Wet prep, genital     Status: Abnormal   Collection Time: 08/12/17  6:17 PM  Result Value Ref Range   Yeast Wet Prep HPF POC NONE SEEN NONE SEEN   Trich, Wet Prep NONE SEEN NONE SEEN   Clue Cells Wet Prep HPF POC NONE SEEN NONE SEEN   WBC, Wet Prep HPF POC FEW (A) NONE SEEN   Sperm NONE SEEN    Discussed at length constipation relief measures and prevention measures, diet. Offered Fleets enema here but she elects to do Fleets at home.  Advised to discontinue new soap, try Sitz; return if dysuria worsens> reassured no evidence UTI or pregnnacy. Discussed other BC methods besides condoms.  Assessment and Plan   1. Left lower quadrant pain   2. Constipation, unspecified constipation type    Allergies as of 08/12/2017   No Known Allergies      Medication List    STOP taking these medications   FIBER ADULT GUMMIES 2 g Chew   ibuprofen 600 MG tablet Commonly known as:  ADVIL,MOTRIN   oxyCODONE-acetaminophen 5-325 MG tablet Commonly known as:  PERCOCET/ROXICET     TAKE these medications   bisacodyl 5 MG EC tablet Commonly known as:  DULCOLAX Take 1 tablet (5 mg total) by mouth 2 (two) times daily.   docusate sodium 100 MG capsule Commonly known as:  COLACE Take 1 capsule (100 mg total) by mouth 2 (two) times daily as needed. What changed:    when to take this  reasons to take this  Follow-up Information    [facility] Schedule an appointment as soon as possible for a visit in 1 month(s).   Specialty:  Obstetrics and Gynecology Contact information: [address] [place] [id number] [phone]          Makye Radle CNM 08/12/2017, 6:00 PM

## 2017-08-15 LAB — GC/CHLAMYDIA PROBE AMP (~~LOC~~) NOT AT ARMC
Chlamydia: NEGATIVE
NEISSERIA GONORRHEA: NEGATIVE

## 2017-08-27 IMAGING — US US OB TRANSVAGINAL
1 series · 15 of 28 positions shown · non-contrast
Comparison: None.

CLINICAL DATA: Abdominal and pelvic cramping with positive
pregnancy test.

EXAM:
OBSTETRIC <14 WK US AND TRANSVAGINAL OB US
TECHNIQUE: Both transabdominal and transvaginal ultrasound examinations were
performed for complete evaluation of the gestation as well as the
maternal uterus, adnexal regions, and pelvic cul-de-sac.
Transvaginal technique was performed to assess early pregnancy.

[Series 1: us ob transvaginal · 15 of 59 slices shown]
[im 1/59]
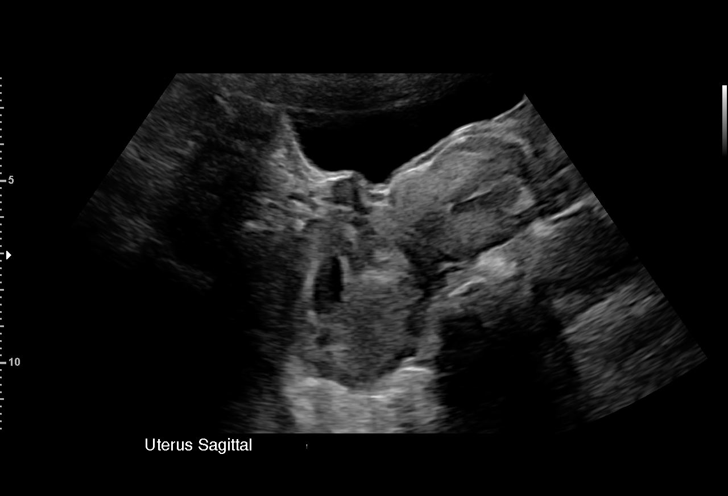
[im 5/59]
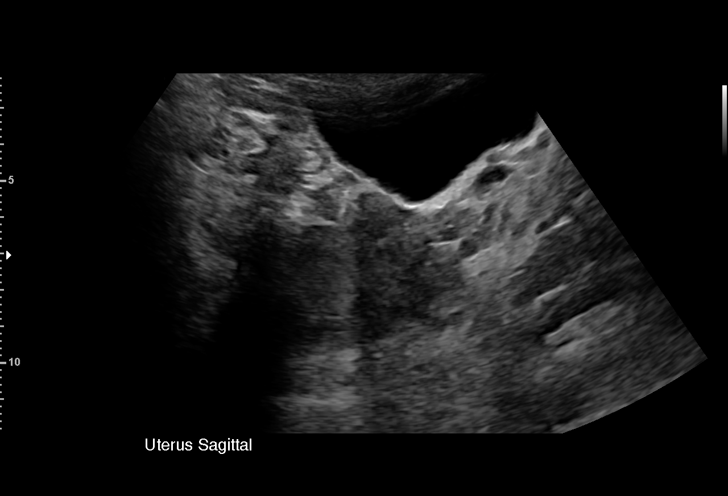
[im 9/59]
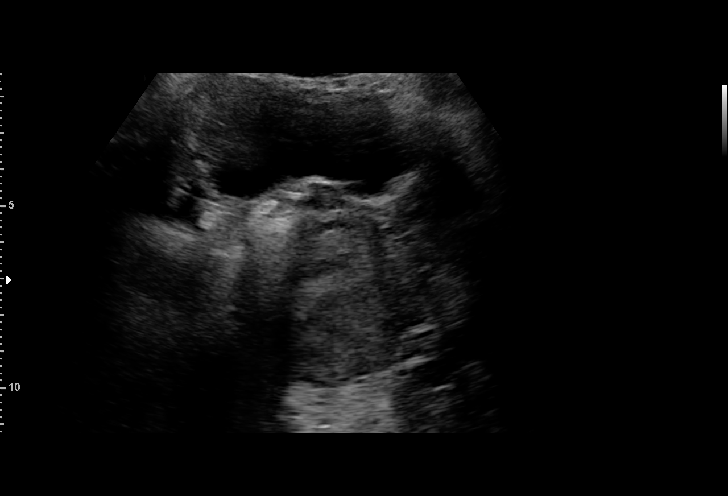
[im 13/59]
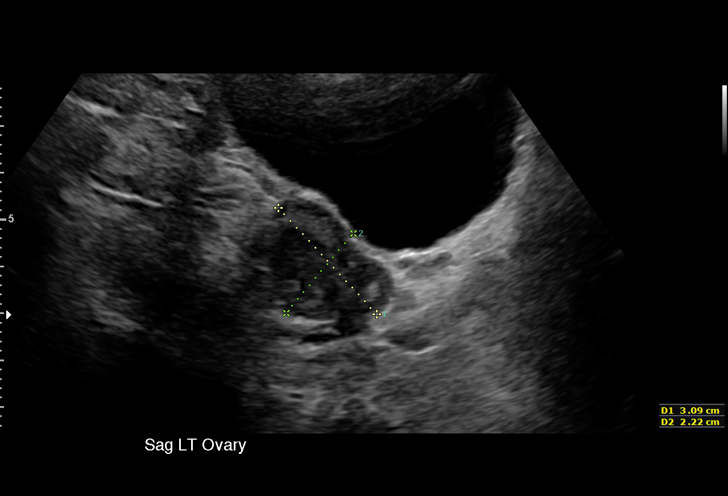
[im 18/59]
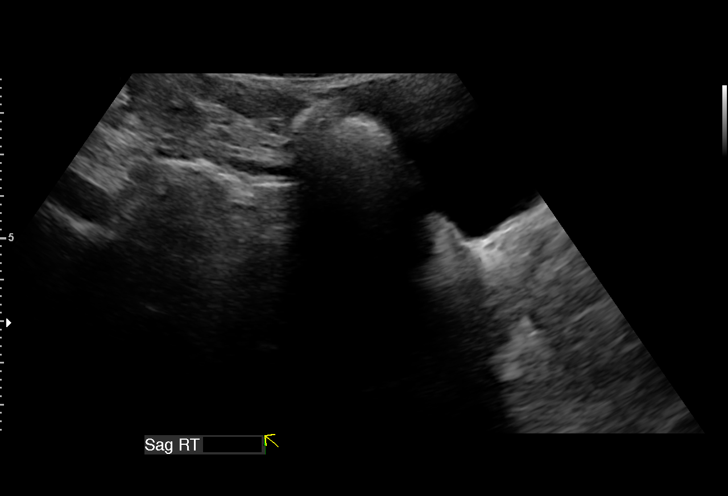
[im 22/59]
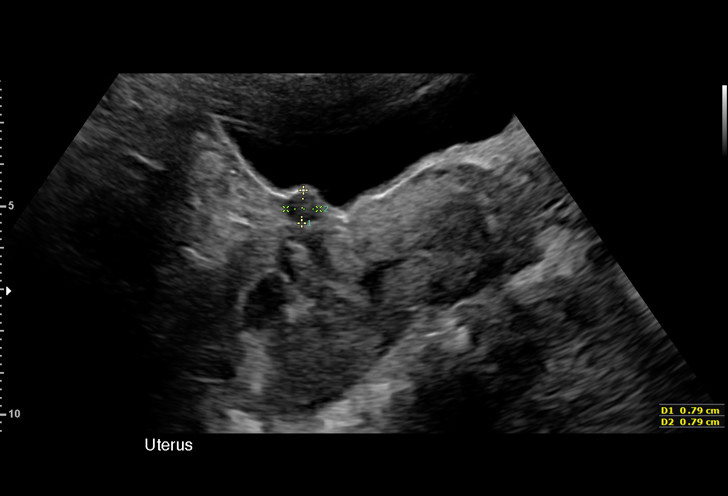
[im 26/59]
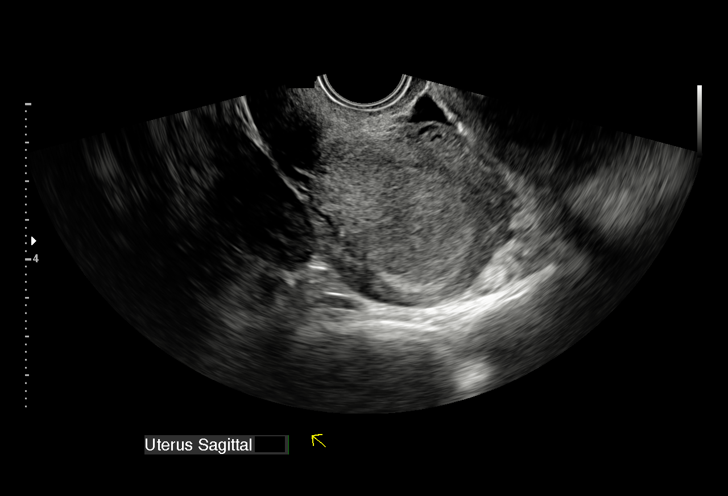
[im 31/59]
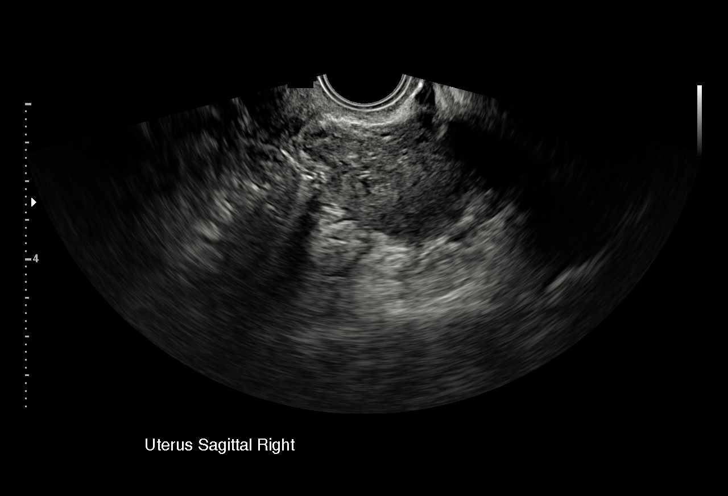
[im 33/59]
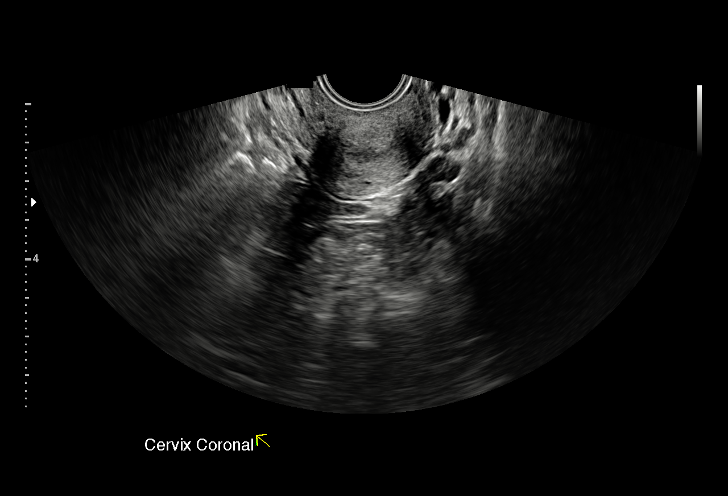
[im 37/59]
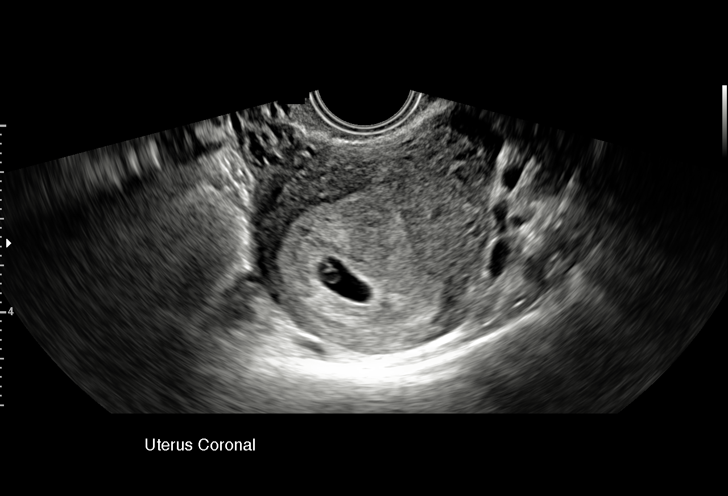
[im 41/59]
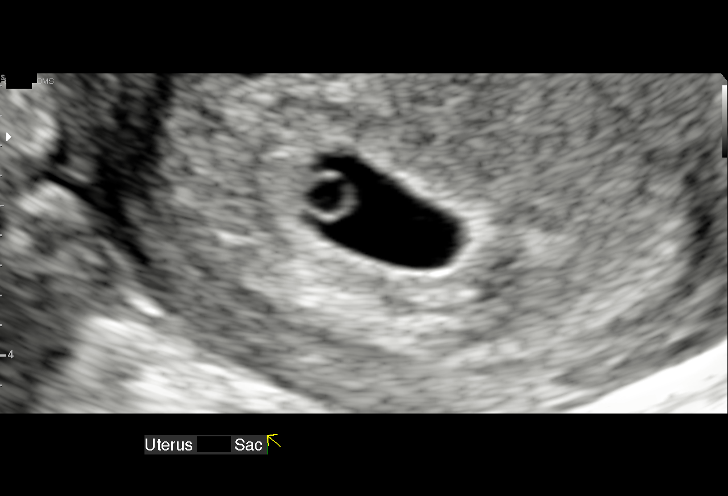
[im 46/59]
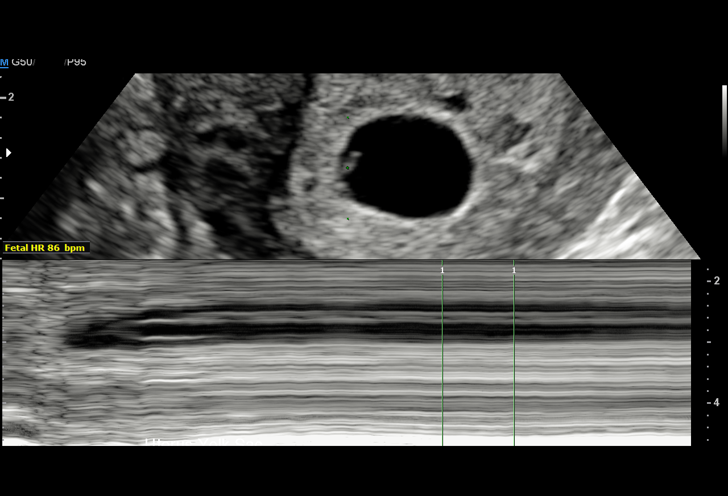
[im 50/59]
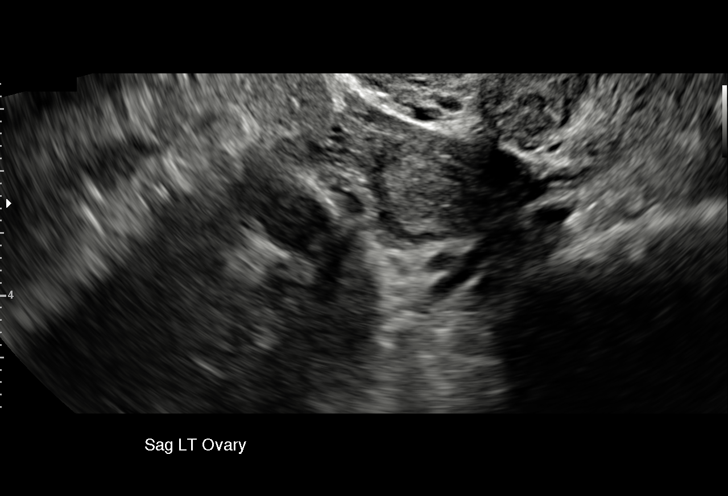
[im 54/59]
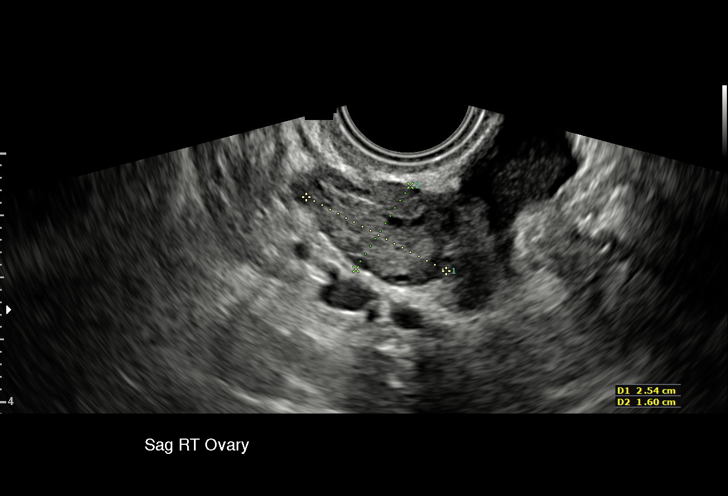
[im 59/59]
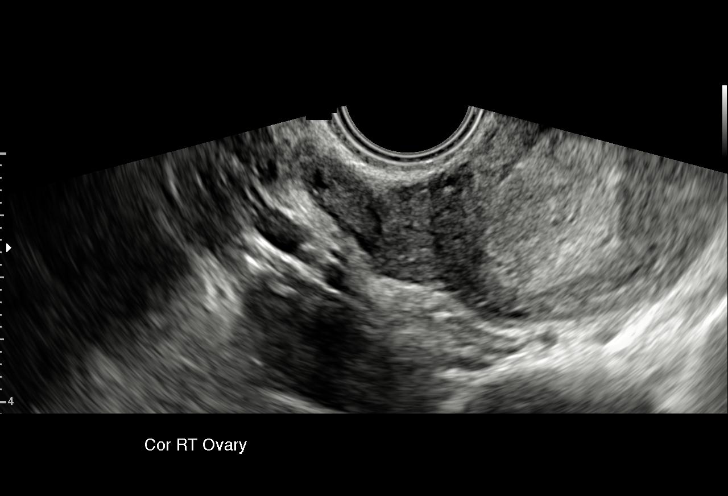

[15 of 28 positions shown; findings below may reference images not displayed]

FINDINGS: Intrauterine gestational sac: Single.

Yolk sac:  Visualized.

Embryo:  Visualized.

Cardiac Activity: Visualized.

Heart Rate: 86  bpm

CRL:  1.4  mm   5 w   0 d

Subchorionic hemorrhage:  None visualized.

Maternal uterus/adnexae: No adnexal mass. Two small fibroids are
identified, measuring up to 2.2 cm.
IMPRESSION: 1. Single living intrauterine gestation at estimated 5 week 0 day
gestational age by crown-rump length.

## 2017-10-11 ENCOUNTER — Other Ambulatory Visit: Payer: Self-pay

## 2017-10-11 ENCOUNTER — Encounter (HOSPITAL_COMMUNITY): Payer: Self-pay

## 2017-10-11 ENCOUNTER — Emergency Department (HOSPITAL_COMMUNITY)
Admission: EM | Admit: 2017-10-11 | Discharge: 2017-10-11 | Disposition: A | Discharge status: Home / Self Care | Payer: Medicaid Other | Attending: Emergency Medicine | Admitting: Emergency Medicine

## 2017-10-11 DIAGNOSIS — R21 Rash and other nonspecific skin eruption: Secondary | ICD-10-CM | POA: Insufficient documentation

## 2017-10-11 DIAGNOSIS — Z79899 Other long term (current) drug therapy: Secondary | ICD-10-CM | POA: Insufficient documentation

## 2017-10-11 MED ORDER — TRIAMCINOLONE ACETONIDE 0.1 % EX CREA: 1.0000 "application " | TOPICAL_CREAM | Freq: Two times a day (BID) | CUTANEOUS | 0 refills | Status: DC

## 2017-10-11 NOTE — ED Provider Notes (Signed)
[facility]-EMERGENCY DEPT Provider Note   CSN: [id number] Arrival date & time: 10/11/17  1191     History   Chief Complaint Chief Complaint  Patient presents with  . Rash    HPI Jaime Frost is a 37 y.o. female.  HPI  37 year old female presents with an itchy rash.  Has been ongoing for about 4 weeks.  Starts on the left side of her abdomen and goes to the left back.  Has been trying hydrocortisone cream and another cream which helps the itchiness but the rash still seems to be getting a little worse.  No new medicines, detergents, animals, or other allergens.  No fevers or oral lesions.  Past Medical History:  Diagnosis Date  . Chlamydia   . UTI (urinary tract infection)     Patient Active Problem List   Diagnosis Date Noted  . Spontaneous vaginal delivery 02/04/2017  . Indication for care in labor or delivery 02/03/2017  . CONSTIPATION 05/26/2009  . CANDIDIASIS OF VULVA AND VAGINA 01/03/2008  . BACTERIAL VAGINITIS 11/16/2007  . VAGINAL DISCHARGE 11/16/2007  . PAP SMEAR, LGSIL, ABNORMAL 09/27/2007  . 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 MICROSCOPIC HEMATURIA 08/18/2007    Past Surgical History:  Procedure Laterality Date  . NO PAST SURGERIES       OB History    Gravida  1   Para  1   Term  1   Preterm      AB      Living  1     SAB      TAB      Ectopic      Multiple  0   Live Births  1            Home Medications    Prior to Admission medications   Medication Sig Start Date End Date Taking? Authorizing Provider  bisacodyl (DULCOLAX) 5 MG EC tablet Take 1 tablet (5 mg total) by mouth 2 (two) times daily. 08/12/17   Poe, Deirdre C, CNM  docusate sodium (COLACE) 100 MG capsule Take 1 capsule (100 mg total) by mouth 2 (two) times daily as needed. 08/12/17   Poe, Deirdre C, CNM  triamcinolone cream (KENALOG) 0.1 % Apply 1 application topically 2 (two) times daily. 10/11/17   Pricilla Loveless, MD    Family History Family History    Problem Relation Age of Onset  . Diabetes Mother   . Hypertension Mother   . Heart disease Maternal Grandmother   . Heart disease Maternal Grandfather     Social History Social History   Tobacco Use  . Smoking status: Never Smoker  . Smokeless tobacco: Never Used  Substance Use Topics  . Alcohol use: Not Currently  . Drug use: No     Allergies   Patient has no known allergies.   Review of Systems Review of Systems  Constitutional: Negative for fever.  Respiratory: Negative for shortness of breath.   Skin: Positive for rash.     Physical Exam Updated Vital Signs BP 118/70 (BP Location: Left Arm)   Pulse 97   Temp 98.5 F (36.9 C) (Oral)   Resp 16   Ht 5' (1.524 m)   Wt 64.4 kg   LMP 10/03/2017   SpO2 100%   BMI 27.73 kg/m   Physical Exam  Constitutional: She appears well-developed and well-nourished.  HENT:  Head: Normocephalic and atraumatic.  Right Ear: External ear normal.  Left Ear: External ear normal.  Nose: Nose normal.  No oral lesions  Eyes: Right eye exhibits no discharge. Left eye exhibits no discharge.  Pulmonary/Chest: Effort normal.  Abdominal: Soft. She exhibits no distension. There is no tenderness.  Neurological: She is alert.  Skin: Skin is warm and dry. Rash noted. Rash is papular.     Diffuse small papules across left abdomen and entire lumbar/lower thoracic back. No vesicles  Nursing note and vitals reviewed.    ED Treatments / Results  Labs (all labs ordered are listed, but only abnormal results are displayed) Labs Reviewed - No data to display  EKG None  Radiology No results found.  Procedures Procedures (including critical care time)  Medications Ordered in ED Medications - No data to display   Initial Impression / Assessment and Plan / ED Course  I have reviewed the triage vital signs and the nursing notes.  Pertinent labs & imaging results that were available during my care of the patient were reviewed by  me and considered in my medical decision making (see chart for details).     Unclear cause of the patient's nonspecific rash.  I will try triamcinolone to help with her symptoms and refer to dermatology.  However there does not appear to be an emergent cause of her rash on history and physical.  Discharged with return precautions.  Final Clinical Impressions(s) / ED Diagnoses   Final diagnoses:  Rash and nonspecific skin eruption    ED Discharge Orders         Ordered    triamcinolone cream (KENALOG) 0.1 %  2 times daily     10/11/17 0846           Pricilla LovelessGoldston, Nathaniel Wakeley, MD 10/11/17 [phone]

## 2017-10-11 NOTE — Discharge Instructions (Signed)
Return to the ER if you develop fevers, trouble breathing, vomiting or any other new/concerning symptoms.

## 2017-10-11 NOTE — ED Notes (Signed)
Bed: WTR5 Expected date:  Expected time:  Means of arrival:  Comments: 

## 2017-10-11 NOTE — ED Triage Notes (Signed)
Patient reports rash on the left flank area x 3 weeks

## 2017-12-07 ENCOUNTER — Encounter (HOSPITAL_COMMUNITY): Payer: Self-pay | Admitting: Emergency Medicine

## 2017-12-07 ENCOUNTER — Other Ambulatory Visit: Payer: Self-pay

## 2017-12-07 ENCOUNTER — Emergency Department (HOSPITAL_COMMUNITY)
Admission: EM | Admit: 2017-12-07 | Discharge: 2017-12-07 | Disposition: A | Discharge status: Home / Self Care | Payer: Medicaid Other | Attending: Emergency Medicine | Admitting: Emergency Medicine

## 2017-12-07 DIAGNOSIS — L600 Ingrowing nail: Secondary | ICD-10-CM

## 2017-12-07 DIAGNOSIS — Z79899 Other long term (current) drug therapy: Secondary | ICD-10-CM | POA: Insufficient documentation

## 2017-12-07 MED ORDER — CEPHALEXIN 500 MG PO CAPS: 500.0000 mg | ORAL_CAPSULE | Freq: Four times a day (QID) | ORAL | 0 refills | Status: DC

## 2017-12-07 MED ORDER — LIDOCAINE HCL 2 % IJ SOLN
20.0000 mL | Freq: Once | INTRAMUSCULAR | Status: AC
  Administered 2017-12-07: 400 mg
  Filled 2017-12-07: qty 20

## 2017-12-07 NOTE — ED Provider Notes (Signed)
[facility] EMERGENCY DEPARTMENT Provider Note   CSN: [id number] Arrival date & time: 12/07/17  1154   History   Chief Complaint Chief Complaint  Patient presents with  . Toe Pain    HPI Jaime Frost is a 37 y.o. female.  HPI   37 year old female presents today with complaints of left great toe pain.  Patient notes over the 2 weeks she has had swelling and pain along the lateral nail fold of the fifth toe.  She notes swelling discharge and warmth.  She denies any other area involvement, denies any trauma.  Denies fever.  She denies any history of diabetes.  Past Medical History:  Diagnosis Date  . Chlamydia   . UTI (urinary tract infection)     Patient Active Problem List   Diagnosis Date Noted  . Spontaneous vaginal delivery 02/04/2017  . Indication for care in labor or delivery 02/03/2017  . CONSTIPATION 05/26/2009  . CANDIDIASIS OF VULVA AND VAGINA 01/03/2008  . BACTERIAL VAGINITIS 11/16/2007  . VAGINAL DISCHARGE 11/16/2007  . PAP SMEAR, LGSIL, ABNORMAL 09/27/2007  . 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 MICROSCOPIC HEMATURIA 08/18/2007    Past Surgical History:  Procedure Laterality Date  . NO PAST SURGERIES       OB History    Gravida  1   Para  1   Term  1   Preterm      AB      Living  1     SAB      TAB      Ectopic      Multiple  0   Live Births  1            Home Medications    Prior to Admission medications   Medication Sig Start Date End Date Taking? Authorizing Provider  bisacodyl (DULCOLAX) 5 MG EC tablet Take 1 tablet (5 mg total) by mouth 2 (two) times daily. 08/12/17   Poe, Deirdre C, CNM  cephALEXin (KEFLEX) 500 MG capsule Take 1 capsule (500 mg total) by mouth 4 (four) times daily. 12/07/17   Laurrie Toppin, Tinnie Gens, PA-C  docusate sodium (COLACE) 100 MG capsule Take 1 capsule (100 mg total) by mouth 2 (two) times daily as needed. 08/12/17   Poe, Deirdre C, CNM  triamcinolone cream (KENALOG) 0.1 % Apply 1 application topically  2 (two) times daily. 10/11/17   Pricilla Loveless, MD    Family History Family History  Problem Relation Age of Onset  . Diabetes Mother   . Hypertension Mother   . Heart disease Maternal Grandmother   . Heart disease Maternal Grandfather     Social History Social History   Tobacco Use  . Smoking status: Never Smoker  . Smokeless tobacco: Never Used  Substance Use Topics  . Alcohol use: Not Currently  . Drug use: No     Allergies   Patient has no known allergies.   Review of Systems Review of Systems  All other systems reviewed and are negative.    Physical Exam Updated Vital Signs BP 105/78 (BP Location: Right Arm)   Pulse 94   Temp 97.6 F (36.4 C) (Oral)   Resp 18   Ht 5' (1.524 m)   Wt 63.5 kg   LMP 12/03/2016 (Exact Date)   SpO2 99%   BMI 27.34 kg/m   Physical Exam  Constitutional: She is oriented to person, place, and time. She appears well-developed and well-nourished.  HENT:  Head: Normocephalic and atraumatic.  Eyes: Pupils  are equal, round, and reactive to light. Conjunctivae are normal. Right eye exhibits no discharge. Left eye exhibits no discharge. No scleral icterus.  Neck: Normal range of motion. No JVD present. No tracheal deviation present.  Pulmonary/Chest: Effort normal. No stridor.  Musculoskeletal:  Redness and swelling with discharge noted on the left great toe lateral nail fold-no surrounding cellulitis- medial nail ingrown   Neurological: She is alert and oriented to person, place, and time. Coordination normal.  Psychiatric: She has a normal mood and affect. Her behavior is normal. Judgment and thought content normal.  Nursing note and vitals reviewed.   ED Treatments / Results  Labs (all labs ordered are listed, but only abnormal results are displayed) Labs Reviewed - No data to display  EKG None  Radiology No results found.  Procedures Excise ingrown toenail Date/Time: 12/07/2017 2:00 PM Performed by: Eyvonne MechanicHedges, Lashawn Bromwell,  PA-C Authorized by: Eyvonne MechanicHedges, Avan Gullett, PA-C  Consent: Verbal consent not obtained. Consent given by: patient Patient understanding: patient states understanding of the procedure being performed Patient consent: the patient's understanding of the procedure matches consent given Procedure consent: procedure consent matches procedure scheduled Patient identity confirmed: verbally with patient Preparation: Patient was prepped and draped in the usual sterile fashion. Local anesthesia used: yes Anesthesia: digital block  Anesthesia: Local anesthesia used: yes Local Anesthetic: lidocaine 2% without epinephrine Anesthetic total: 6 mL  Sedation: Patient sedated: no  Patient tolerance: Patient tolerated the procedure well with no immediate complications     Medications Ordered in ED Medications  lidocaine (XYLOCAINE) 2 % (with pres) injection 400 mg (400 mg Infiltration Given 12/07/17 1223)     Initial Impression / Assessment and Plan / ED Course  I have reviewed the triage vital signs and the nursing notes.  Pertinent labs & imaging results that were available during my care of the patient were reviewed by me and considered in my medical decision making (see chart for details).    Labs:   Imaging:  Consults:  Therapeutics: Lidocaine  Discharge Meds: Keflex  Assessment/Plan: 37 year old female presents today with ingrown toenail with small amount of discharge and swelling.  A did a resection of the medial aspect of the nail.  Patient will follow-up as an outpatient with podiatry as needed, and she will return immediately if she develops any new or worsening signs or symptoms.  She had no further questions or concerns the time discharge.   Final Clinical Impressions(s) / ED Diagnoses   Final diagnoses:  Ingrown toenail    ED Discharge Orders         Ordered    cephALEXin (KEFLEX) 500 MG capsule  4 times daily     12/07/17 1402           Eyvonne MechanicHedges, Jermia Rigsby,  PA-C 12/07/17 1403    Curatolo, Adam, DO 12/07/17 1503

## 2017-12-07 NOTE — ED Triage Notes (Signed)
Per Pt she come from home complaining of her right great toe pain.  She states that she went to [facility] a week ago and it has gotten worse since then.  Pt has a wound on the left side of great toe complains of pain 10/10.  NAD noted at this time.

## 2017-12-07 NOTE — Discharge Instructions (Addendum)
Please read attached information. If you experience any new or worsening signs or symptoms please return to the emergency room for evaluation. Please follow-up with your primary care provider or specialist as discussed. Please use medication prescribed only as directed and discontinue taking if you have any concerning signs or symptoms.   °

## 2017-12-07 NOTE — ED Notes (Signed)
Patient given discharge instructions and verbalized understanding.  Patient stable to discharge at this time.  Patient is alert and oriented to baseline.  No distressed noted at this time.  All belongings taken with the patient at discharge.   

## 2018-08-17 ENCOUNTER — Other Ambulatory Visit: Payer: Self-pay

## 2018-08-17 DIAGNOSIS — Z20822 Contact with and (suspected) exposure to covid-19: Secondary | ICD-10-CM

## 2018-08-18 LAB — NOVEL CORONAVIRUS, NAA: SARS-CoV-2, NAA: NOT DETECTED

## 2018-09-18 ENCOUNTER — Other Ambulatory Visit: Payer: Self-pay

## 2018-09-18 ENCOUNTER — Encounter (HOSPITAL_COMMUNITY): Payer: Self-pay | Admitting: Emergency Medicine

## 2018-09-18 ENCOUNTER — Ambulatory Visit (HOSPITAL_COMMUNITY)
Admission: EM | Admit: 2018-09-18 | Discharge: 2018-09-18 | Disposition: A | Discharge status: Home / Self Care | Payer: Medicaid Other | Attending: Internal Medicine | Admitting: Internal Medicine

## 2018-09-18 DIAGNOSIS — S39012A Strain of muscle, fascia and tendon of lower back, initial encounter: Secondary | ICD-10-CM

## 2018-09-18 DIAGNOSIS — X500XXA Overexertion from strenuous movement or load, initial encounter: Secondary | ICD-10-CM

## 2018-09-18 MED ORDER — CYCLOBENZAPRINE HCL 10 MG PO TABS: 10.0000 mg | ORAL_TABLET | Freq: Three times a day (TID) | ORAL | 0 refills | Status: DC | PRN

## 2018-09-18 MED ORDER — ACETAMINOPHEN 500 MG PO TABS: 500.0000 mg | ORAL_TABLET | Freq: Four times a day (QID) | ORAL | 0 refills | Status: DC | PRN

## 2018-09-18 MED ORDER — KETOROLAC TROMETHAMINE 30 MG/ML IJ SOLN
INTRAMUSCULAR | Status: AC
  Filled 2018-09-18: qty 1

## 2018-09-18 MED ORDER — KETOROLAC TROMETHAMINE 30 MG/ML IJ SOLN
30.0000 mg | Freq: Once | INTRAMUSCULAR | Status: AC
  Administered 2018-09-18: 30 mg via INTRAMUSCULAR

## 2018-09-18 MED ORDER — NAPROXEN 375 MG PO TABS: 375.0000 mg | ORAL_TABLET | Freq: Two times a day (BID) | ORAL | 0 refills | Status: DC

## 2018-09-18 NOTE — ED Provider Notes (Signed)
[facility]    CSN: [id number] Arrival date & time: 09/18/18  1454      History   Chief Complaint Chief Complaint  Patient presents with  . Back Pain    HPI Jaime Frost is a 38 y.o. female with no past medical history comes to urgent care with complaints of right gluteal pain and left hip pain of a few days duration.  Patient describes pain as sharp and throbbing.  Onset of pain is insidious and is been sustained.  Pain is worsened by palpation and movement around the left hip.  She is tried over-the-counter NSAIDs with no improvement.  She denies any numbness or tingling or weakness in the left leg.  No trauma to the left leg.  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   HPI  Past Medical History:  Diagnosis Date  . Chlamydia   . UTI (urinary tract infection)     Patient Active Problem List   Diagnosis Date Noted  . Spontaneous vaginal delivery 02/04/2017  . Indication for care in labor or delivery 02/03/2017  . CONSTIPATION 05/26/2009  . CANDIDIASIS OF VULVA AND VAGINA 01/03/2008  . BACTERIAL VAGINITIS 11/16/2007  . VAGINAL DISCHARGE 11/16/2007  . PAP SMEAR, LGSIL, ABNORMAL 09/27/2007  . 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 MICROSCOPIC HEMATURIA 08/18/2007    Past Surgical History:  Procedure Laterality Date  . NO PAST SURGERIES      OB History    Gravida  1   Para  1   Term  1   Preterm      AB      Living  1     SAB      TAB      Ectopic      Multiple  0   Live Births  1            Home Medications    Prior to Admission medications   Medication Sig Start Date End Date Taking? Authorizing Provider  acetaminophen (TYLENOL) 500 MG tablet Take 1 tablet (500 mg total) by mouth every 6 (six) hours as needed. 09/18/18   Lamptey, Myrene Galas, MD  bisacodyl (DULCOLAX) 5 MG EC tablet Take 1 tablet (5 mg total) by mouth 2 (two) times daily. 08/12/17   Poe, Deirdre C, CNM  cyclobenzaprine (FLEXERIL) 10 MG tablet Take 1 tablet (10  mg total) by mouth 3 (three) times daily as needed for muscle spasms. 09/18/18   Lamptey, Myrene Galas, MD  docusate sodium (COLACE) 100 MG capsule Take 1 capsule (100 mg total) by mouth 2 (two) times daily as needed. 08/12/17   Poe, Deirdre C, CNM  naproxen (NAPROSYN) 375 MG tablet Take 1 tablet (375 mg total) by mouth 2 (two) times daily. 09/18/18   LampteyMyrene Galas, MD  triamcinolone cream (KENALOG) 0.1 % Apply 1 application topically 2 (two) times daily. 10/11/17   Sherwood Gambler, MD    Family History Family History  Problem Relation Age of Onset  . Diabetes Mother   . Hypertension Mother   . Heart disease Maternal Grandmother   . Heart disease Maternal Grandfather     Social History Social History   Tobacco Use  . Smoking status: Never Smoker  . Smokeless tobacco: Never Used  Substance Use Topics  . Alcohol use: Not Currently  . Drug use: No     Allergies   Patient has no known allergies.   Review of Systems Review of Systems  Constitutional: Positive for activity change.  HENT: Negative.   Respiratory: Negative.   Cardiovascular: Negative.   Gastrointestinal: Negative.  Negative for abdominal pain, diarrhea, nausea and vomiting.  Genitourinary: Negative for difficulty urinating, dysuria, frequency, menstrual problem, pelvic pain and urgency.  Musculoskeletal: Positive for arthralgias, back pain and myalgias. Negative for joint swelling.  Skin: Negative.   Neurological: Negative for dizziness, weakness and headaches.     Physical Exam Triage Vital Signs ED Triage Vitals  Enc Vitals Group     BP 09/18/18 1539 104/89     Pulse Rate 09/18/18 1539 97     Resp 09/18/18 1539 18     Temp 09/18/18 1539 98.7 F (37.1 C)     Temp Source 09/18/18 1539 Oral     SpO2 09/18/18 1539 100 %     Weight --      Height --      Head Circumference --      Peak Flow --      Pain Score 09/18/18 1540 8     Pain Loc --      Pain Edu? --      Excl. in GC? --    No data  found.  Updated Vital Signs BP 104/89 (BP Location: Right Arm)   Pulse 97   Temp 98.7 F (37.1 C) (Oral)   Resp 18   SpO2 100%   Visual Acuity Right Eye Distance:   Left Eye Distance:   Bilateral Distance:    Right Eye Near:   Left Eye Near:    Bilateral Near:     Physical Exam Vitals signs and nursing note reviewed.  Constitutional:      General: She is in acute distress.     Appearance: She is ill-appearing. She is not toxic-appearing.  Cardiovascular:     Rate and Rhythm: Normal rate and regular rhythm.     Pulses: Normal pulses.     Heart sounds: Normal heart sounds. No murmur. No friction rub.  Pulmonary:     Effort: Pulmonary effort is normal. No respiratory distress.     Breath sounds: Normal breath sounds. No rhonchi or rales.  Abdominal:     General: Bowel sounds are normal. There is no distension.     Palpations: Abdomen is soft.     Tenderness: There is no guarding or rebound.  Musculoskeletal:     Comments: Tenderness on palpation over the left sacroiliac joint and left gluteal area.  Deep tendon reflex in the lower extremities is preserved.  Power in the lower extremities are preserved.  No sensory deficits in the lower extremities.  Skin:    General: Skin is warm.     Capillary Refill: Capillary refill takes less than 2 seconds.     Findings: No bruising, erythema or lesion.  Neurological:     General: No focal deficit present.     Mental Status: She is alert and oriented to person, place, and time.      UC Treatments / Results  Labs (all labs ordered are listed, but only abnormal results are displayed) Labs Reviewed - No data to display  EKG   Radiology No results found.  Procedures Procedures (including critical care time)  Medications Ordered in UC Medications  ketorolac (TORADOL) 30 MG/ML injection 30 mg (30 mg Intramuscular Given 09/18/18 1628)  ketorolac (TORADOL) 30 MG/ML injection (has no administration in time range)    Initial  Impression / Assessment and Plan / UC Course  I have reviewed the triage vital signs and the  nursing notes.  Pertinent labs & imaging results that were available during my care of the patient were reviewed by me and considered in my medical decision making (see chart for details).     1.  Lumbar sprain with intractable pain: Toradol 30 mg IM x1 dose Patient with prescribed naproxen 375 mg twice daily Tylenol 500 mg as breakthrough pain Flexeril 10 mg 3 times daily as needed for muscle spasms Heating pad for 3 to 5 minutes at a time Gentle stretches If patient symptoms worsens or if she develops any numbness or tingling or weakness in the lower extremities she is advised to return to urgent care or emergency department promptly to be reevaluated. Final Clinical Impressions(s) / UC Diagnoses   Final diagnoses:  Strain of lumbar region, initial encounter   Discharge Instructions   None    ED Prescriptions    Medication Sig Dispense Auth. Provider   naproxen (NAPROSYN) 375 MG tablet Take 1 tablet (375 mg total) by mouth 2 (two) times daily. 20 tablet Lamptey, Britta MccreedyPhilip O, MD   acetaminophen (TYLENOL) 500 MG tablet Take 1 tablet (500 mg total) by mouth every 6 (six) hours as needed. 30 tablet Lamptey, Britta MccreedyPhilip O, MD   cyclobenzaprine (FLEXERIL) 10 MG tablet Take 1 tablet (10 mg total) by mouth 3 (three) times daily as needed for muscle spasms. 30 tablet Lamptey, Britta MccreedyPhilip O, MD     Controlled Substance Prescriptions [place] Controlled Substance Registry consulted? No   Merrilee JanskyLamptey, Philip O, MD 09/19/18 [phone]

## 2018-09-18 NOTE — ED Triage Notes (Addendum)
Pt sts lower back pain into left buttocks and hip

## 2019-02-02 ENCOUNTER — Ambulatory Visit: Payer: Medicaid Other | Attending: Internal Medicine

## 2019-02-02 DIAGNOSIS — Z20822 Contact with and (suspected) exposure to covid-19: Secondary | ICD-10-CM

## 2019-02-03 LAB — NOVEL CORONAVIRUS, NAA: SARS-CoV-2, NAA: NOT DETECTED

## 2019-03-31 ENCOUNTER — Other Ambulatory Visit: Payer: Self-pay

## 2019-03-31 ENCOUNTER — Emergency Department (HOSPITAL_COMMUNITY)
Admission: EM | Admit: 2019-03-31 | Discharge: 2019-03-31 | Disposition: A | Discharge status: Home / Self Care | Payer: Medicaid Other | Attending: Emergency Medicine | Admitting: Emergency Medicine

## 2019-03-31 ENCOUNTER — Emergency Department (HOSPITAL_COMMUNITY): Payer: Medicaid Other

## 2019-03-31 ENCOUNTER — Encounter (HOSPITAL_COMMUNITY): Payer: Self-pay | Admitting: Emergency Medicine

## 2019-03-31 DIAGNOSIS — M79644 Pain in right finger(s): Secondary | ICD-10-CM

## 2019-03-31 DIAGNOSIS — Y939 Activity, unspecified: Secondary | ICD-10-CM | POA: Insufficient documentation

## 2019-03-31 DIAGNOSIS — Y999 Unspecified external cause status: Secondary | ICD-10-CM | POA: Insufficient documentation

## 2019-03-31 DIAGNOSIS — W230XXA Caught, crushed, jammed, or pinched between moving objects, initial encounter: Secondary | ICD-10-CM | POA: Insufficient documentation

## 2019-03-31 DIAGNOSIS — Z79899 Other long term (current) drug therapy: Secondary | ICD-10-CM | POA: Insufficient documentation

## 2019-03-31 DIAGNOSIS — S60021A Contusion of right index finger without damage to nail, initial encounter: Secondary | ICD-10-CM | POA: Insufficient documentation

## 2019-03-31 DIAGNOSIS — Y92810 Car as the place of occurrence of the external cause: Secondary | ICD-10-CM | POA: Insufficient documentation

## 2019-03-31 MED ORDER — ACETAMINOPHEN 500 MG PO TABS
1000.0000 mg | ORAL_TABLET | Freq: Once | ORAL | Status: AC
  Administered 2019-03-31: 20:00:00 1000 mg via ORAL
  Filled 2019-03-31: qty 2

## 2019-03-31 NOTE — ED Provider Notes (Signed)
[facility] EMERGENCY DEPARTMENT Provider Note   CSN: [id number] Arrival date & time: 03/31/19  1656     History Chief Complaint  Patient presents with  . Finger Injury    Bernardine Langworthy is a 39 y.o. female who presents for evaluation of pain, swelling to the right index finger that began yesterday after getting it caught in a car door.  Patient reports that the car door slammed on her finger and since then has had worsening pain, swelling.  She states she has soaked it in water and take an aspirin with minimal improvement in pain.  She can move it but does report pain with movement.  She states she does not have any numbness/weakness.  She has not tried any other methods.    The history is provided by the patient.       Past Medical History:  Diagnosis Date  . Chlamydia   . UTI (urinary tract infection)     Patient Active Problem List   Diagnosis Date Noted  . Spontaneous vaginal delivery 02/04/2017  . Indication for care in labor or delivery 02/03/2017  . CONSTIPATION 05/26/2009  . CANDIDIASIS OF VULVA AND VAGINA 01/03/2008  . BACTERIAL VAGINITIS 11/16/2007  . VAGINAL DISCHARGE 11/16/2007  . PAP SMEAR, LGSIL, ABNORMAL 09/27/2007  . 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 MICROSCOPIC HEMATURIA 08/18/2007    Past Surgical History:  Procedure Laterality Date  . NO PAST SURGERIES       OB History    Gravida  1   Para  1   Term  1   Preterm      AB      Living  1     SAB      TAB      Ectopic      Multiple  0   Live Births  1           Family History  Problem Relation Age of Onset  . Diabetes Mother   . Hypertension Mother   . Heart disease Maternal Grandmother   . Heart disease Maternal Grandfather     Social History   Tobacco Use  . Smoking status: Never Smoker  . Smokeless tobacco: Never Used  Substance Use Topics  . Alcohol use: Not Currently  . Drug use: No    Home Medications Prior to Admission medications   Medication Sig  Start Date End Date Taking? Authorizing Provider  acetaminophen (TYLENOL) 500 MG tablet Take 1 tablet (500 mg total) by mouth every 6 (six) hours as needed. 09/18/18   Lamptey, Britta Mccreedy, MD  bisacodyl (DULCOLAX) 5 MG EC tablet Take 1 tablet (5 mg total) by mouth 2 (two) times daily. 08/12/17   Poe, Deirdre C, CNM  cyclobenzaprine (FLEXERIL) 10 MG tablet Take 1 tablet (10 mg total) by mouth 3 (three) times daily as needed for muscle spasms. 09/18/18   Lamptey, Britta Mccreedy, MD  docusate sodium (COLACE) 100 MG capsule Take 1 capsule (100 mg total) by mouth 2 (two) times daily as needed. 08/12/17   Poe, Deirdre C, CNM  naproxen (NAPROSYN) 375 MG tablet Take 1 tablet (375 mg total) by mouth 2 (two) times daily. 09/18/18   LampteyBritta Mccreedy, MD  triamcinolone cream (KENALOG) 0.1 % Apply 1 application topically 2 (two) times daily. 10/11/17   Pricilla Loveless, MD    Allergies    Patient has no known allergies.  Review of Systems   Review of Systems  Musculoskeletal:  Right index finger  Neurological: Negative for weakness and numbness.  All other systems reviewed and are negative.   Physical Exam Updated Vital Signs BP (!) 120/105   Pulse 98   Temp 97.9 F (36.6 C) (Oral)   Resp 19   LMP 03/06/2019   SpO2 99%   Physical Exam Vitals and nursing note reviewed.  Constitutional:      Appearance: She is well-developed.  HENT:     Head: Normocephalic and atraumatic.  Eyes:     General: No scleral icterus.       Right eye: No discharge.        Left eye: No discharge.     Conjunctiva/sclera: Conjunctivae normal.  Cardiovascular:     Pulses:          Radial pulses are 2+ on the right side and 2+ on the left side.  Pulmonary:     Effort: Pulmonary effort is normal.  Musculoskeletal:     Comments: Diffuse tenderness palpation of the right index finger.  No deformity or crepitus noted.  Diffuse soft tissue swelling. Compartments soft.  No overlying warmth, erythema.  Flexion/extension intact  but with subjective reports of pain.  Skin:    General: Skin is warm and dry.     Comments: Good distal cap refill.  RUE is not dusky in appearance or cool to touch.  No evidence of subungual hematoma.  Neurological:     Mental Status: She is alert.     Comments: Sensation intact along major nerve distributions of BUE  Psychiatric:        Speech: Speech normal.        Behavior: Behavior normal.     ED Results / Procedures / Treatments   Labs (all labs ordered are listed, but only abnormal results are displayed) Labs Reviewed - No data to display  EKG None  Radiology DG Hand Complete Right  Result Date: 03/31/2019 CLINICAL DATA:  Slammed index finger in car door EXAM: RIGHT HAND - COMPLETE 3+ VIEW COMPARISON:  None. FINDINGS: There is no evidence of fracture or dislocation. There is no evidence of arthropathy or other focal bone abnormality. Soft tissue edema about the index digit. IMPRESSION: No fracture or dislocation of the right hand. Soft tissue edema about the index digit. Electronically Signed   By: Eddie Candle M.D.   On: 03/31/2019 18:05    Procedures Procedures (including critical care time)  Medications Ordered in ED Medications  acetaminophen (TYLENOL) tablet 1,000 mg (1,000 mg Oral Given 03/31/19 1952)    ED Course  I have reviewed the triage vital signs and the nursing notes.  Pertinent labs & imaging results that were available during my care of the patient were reviewed by me and considered in my medical decision making (see chart for details).    MDM Rules/Calculators/A&P                      39 year old female who presents for evaluation of right index pain, swelling after getting it caught in a car door yesterday.  No numbness/weakness.  On initially arrival, she is afebrile, nontoxic-appearing.  Vital signs are stable.  She is neurovascularly intact.  On exam, she has diffuse soft tissue edema noted to the index finger.  No overlying warmth, erythema.   Compartments are soft.  No evidence of subungual hematoma.  No deformity or crepitus noted.  Consider fracture versus contusion.  History/physical exam not concerning for septic arthritis, ischemic limb, flexor  tenosynovitis.  X-rays ordered at triage.  X-rays reviewed.  There is mention of soft tissue edema.  No evidence of acute bony abnormality.  Discussed results with patient.  Encouraged at home supportive care measures.  We will put in a finger splint for support and stabilization. At this time, patient exhibits no emergent life-threatening condition that require further evaluation in ED or admission. Patient had ample opportunity for questions and discussion. All patient's questions were answered with full understanding. Strict return precautions discussed. Patient expresses understanding and agreement to plan.   Portions of this note were generated with Scientist, clinical (histocompatibility and immunogenetics). Dictation errors may occur despite best attempts at proofreading.   Final Clinical Impression(s) / ED Diagnoses Final diagnoses:  Pain of finger of right hand  Contusion of right index finger without damage to nail, initial encounter    Rx / DC Orders ED Discharge Orders    None       Rosana Hoes 03/31/19 2218    Sabas Sous, MD 04/01/19 1649

## 2019-03-31 NOTE — ED Notes (Signed)
Discharge instructions discussed with pt. Pt verbalized understanding with no questions at this time.  

## 2019-03-31 NOTE — ED Triage Notes (Signed)
Slammed R index finger in door yesterday.  C/o pain and swelling.

## 2019-03-31 NOTE — Discharge Instructions (Signed)
You can take Tylenol or Ibuprofen as directed for pain. You can alternate Tylenol and Ibuprofen every 4 hours. If you take Tylenol at 1pm, then you can take Ibuprofen at 5pm. Then you can take Tylenol again at 9pm.   Follow the RICE (Rest, Ice, Compression, Elevation) protocol as directed.   Return emergency department for any worsening pain, swelling, fevers, redness or warmth or any other worsening concerning symptoms.

## 2019-04-03 ENCOUNTER — Emergency Department (HOSPITAL_COMMUNITY)
Admission: EM | Admit: 2019-04-03 | Discharge: 2019-04-03 | Disposition: A | Discharge status: Home / Self Care | Payer: Medicaid Other | Attending: Emergency Medicine | Admitting: Emergency Medicine

## 2019-04-03 ENCOUNTER — Other Ambulatory Visit: Payer: Self-pay

## 2019-04-03 DIAGNOSIS — L03011 Cellulitis of right finger: Secondary | ICD-10-CM

## 2019-04-03 DIAGNOSIS — Y929 Unspecified place or not applicable: Secondary | ICD-10-CM | POA: Insufficient documentation

## 2019-04-03 DIAGNOSIS — W230XXA Caught, crushed, jammed, or pinched between moving objects, initial encounter: Secondary | ICD-10-CM | POA: Insufficient documentation

## 2019-04-03 DIAGNOSIS — Y939 Activity, unspecified: Secondary | ICD-10-CM | POA: Insufficient documentation

## 2019-04-03 DIAGNOSIS — Y999 Unspecified external cause status: Secondary | ICD-10-CM | POA: Insufficient documentation

## 2019-04-03 MED ORDER — OXYCODONE-ACETAMINOPHEN 5-325 MG PO TABS: 1.0000 | ORAL_TABLET | ORAL | 0 refills | Status: DC | PRN

## 2019-04-03 MED ORDER — IBUPROFEN 800 MG PO TABS
800.0000 mg | ORAL_TABLET | Freq: Once | ORAL | Status: AC
  Administered 2019-04-03: 20:00:00 800 mg via ORAL
  Filled 2019-04-03: qty 1

## 2019-04-03 MED ORDER — CEPHALEXIN 500 MG PO CAPS: 500.0000 mg | ORAL_CAPSULE | Freq: Three times a day (TID) | ORAL | 0 refills | Status: AC

## 2019-04-03 NOTE — Discharge Instructions (Addendum)
Please keep your wound clean and dry.  We discussed ways to do so at home.  I prescribe a short course of antibiotics for you, please take 1 tablet 3 times a day for the next 7 days.  I have also prescribed a short course of pain medication, you may take this tonight in order to treat your pain.  If you variance any worsening redness, swelling, more drainage from the wound you need to return to the emergency department.

## 2019-04-03 NOTE — ED Provider Notes (Signed)
[facility] EMERGENCY DEPARTMENT Provider Note   CSN: [id number] Arrival date & time: 04/03/19  1738     History No chief complaint on file.   Jaime Frost is a 39 y.o. female.  39 y.o female with no pertinent PMH presents to the ED with a chief complaint of right index finger discoloration x 2 days.  She was evaluated in the ED approximately 3 days ago after she slammed a door on her right index finger.  Patient reports she had her finger placed on the splint, after removing that she noted discoloration to the area, she feels like there is a throbbing sensation to the distal aspect of this.  This makes it difficult for her to range her right index finger.  Is been taking Tylenol for the pain without any improvement in her symptoms.  Reports no fevers, or other trauma or injuries.   The history is provided by the patient.       Past Medical History:  Diagnosis Date  . Chlamydia   . UTI (urinary tract infection)     Patient Active Problem List   Diagnosis Date Noted  . Spontaneous vaginal delivery 02/04/2017  . Indication for care in labor or delivery 02/03/2017  . CONSTIPATION 05/26/2009  . CANDIDIASIS OF VULVA AND VAGINA 01/03/2008  . BACTERIAL VAGINITIS 11/16/2007  . VAGINAL DISCHARGE 11/16/2007  . PAP SMEAR, LGSIL, ABNORMAL 09/27/2007  . 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 MICROSCOPIC HEMATURIA 08/18/2007    Past Surgical History:  Procedure Laterality Date  . NO PAST SURGERIES       OB History    Gravida  1   Para  1   Term  1   Preterm      AB      Living  1     SAB      TAB      Ectopic      Multiple  0   Live Births  1           Family History  Problem Relation Age of Onset  . Diabetes Mother   . Hypertension Mother   . Heart disease Maternal Grandmother   . Heart disease Maternal Grandfather     Social History   Tobacco Use  . Smoking status: Never Smoker  . Smokeless tobacco: Never Used  Substance Use Topics  . Alcohol  use: Not Currently  . Drug use: No    Home Medications Prior to Admission medications   Medication Sig Start Date End Date Taking? Authorizing Provider  acetaminophen (TYLENOL) 500 MG tablet Take 1 tablet (500 mg total) by mouth every 6 (six) hours as needed. Patient taking differently: Take 500 mg by mouth every 6 (six) hours as needed for mild pain.  09/18/18  Yes Lamptey, Myrene Galas, MD  ibuprofen (ADVIL) 200 MG tablet Take 200 mg by mouth every 6 (six) hours as needed for moderate pain.    Yes [provider]  bisacodyl (DULCOLAX) 5 MG EC tablet Take 1 tablet (5 mg total) by mouth 2 (two) times daily. Patient not taking: Reported on 04/03/2019 08/12/17   Poe, Deirdre C, CNM  cephALEXin (KEFLEX) 500 MG capsule Take 1 capsule (500 mg total) by mouth 3 (three) times daily for 7 days. 04/03/19 04/10/19  Janeece Fitting, PA-C  cyclobenzaprine (FLEXERIL) 10 MG tablet Take 1 tablet (10 mg total) by mouth 3 (three) times daily as needed for muscle spasms. Patient not taking: Reported on 04/03/2019 09/18/18  Merrilee Jansky, MD  docusate sodium (COLACE) 100 MG capsule Take 1 capsule (100 mg total) by mouth 2 (two) times daily as needed. Patient not taking: Reported on 04/03/2019 08/12/17   Poe, Deirdre C, CNM  naproxen (NAPROSYN) 375 MG tablet Take 1 tablet (375 mg total) by mouth 2 (two) times daily. Patient not taking: Reported on 04/03/2019 09/18/18   Merrilee Jansky, MD  oxyCODONE-acetaminophen (PERCOCET/ROXICET) 5-325 MG tablet Take 1 tablet by mouth every 4 (four) hours as needed for severe pain. 04/03/19   Claude Manges, PA-C  triamcinolone cream (KENALOG) 0.1 % Apply 1 application topically 2 (two) times daily. Patient not taking: Reported on 04/03/2019 10/11/17   Pricilla Loveless, MD    Allergies    Patient has no known allergies.  Review of Systems   Review of Systems  Constitutional: Negative for fever.  Skin: Positive for color change and wound.    Physical Exam Updated Vital  Signs BP 109/72 (BP Location: Left Arm)   Pulse (!) 101   Temp 98.4 F (36.9 C) (Oral)   Resp 14   Ht 5' (1.524 m)   Wt 65.8 kg   LMP 03/06/2019   SpO2 98%   BMI 28.32 kg/m   Physical Exam Vitals and nursing note reviewed.  Constitutional:      Appearance: Normal appearance.  HENT:     Head: Normocephalic and atraumatic.     Nose: Nose normal.     Mouth/Throat:     Mouth: Mucous membranes are moist.  Cardiovascular:     Rate and Rhythm: Normal rate.  Pulmonary:     Effort: Pulmonary effort is normal.  Abdominal:     General: Abdomen is flat.  Musculoskeletal:     Cervical back: Normal range of motion and neck supple.     Comments: Capillary refill is intact, discoloration noted to the right index finger, collection of fluctuance.  Pulses are present and symmetric.  Full range of motion with pain.  Limited flexion of DIP due to pain.  Nailbed is intact.  Skin:    General: Skin is warm and dry.     Findings: Erythema present.     Comments: Please see photos attached.  Neurological:     Mental Status: She is alert and oriented to person, place, and time.         ED Results / Procedures / Treatments   Labs (all labs ordered are listed, but only abnormal results are displayed) Labs Reviewed - No data to display  EKG None  Radiology No results found.  Procedures Drain paronychia  Date/Time: 04/03/2019 8:41 PM Performed by: Claude Manges, PA-C Authorized by: Claude Manges, PA-C  Consent: Verbal consent obtained. Consent given by: patient Patient understanding: patient states understanding of the procedure being performed Patient identity confirmed: verbally with patient Local anesthesia used: no  Anesthesia: Local anesthesia used: no  Sedation: Patient sedated: no  Patient tolerance: patient tolerated the procedure well with no immediate complications Comments: Extensive amount of copious discharge drained from right index finger.  Patient tolerated the  procedure well.    (including critical care time)  Medications Ordered in ED Medications  ibuprofen (ADVIL) tablet 800 mg (800 mg Oral Given 04/03/19 2023)    ED Course  I have reviewed the triage vital signs and the nursing notes.  Pertinent labs & imaging results that were available during my care of the patient were reviewed by me and considered in my medical decision making (see chart for  details).    MDM Rules/Calculators/A&P   Patient with no pertinent past medical history presents to the ED with complaints of right index finger pain which began 2 days ago.  Patient was seen 3 days ago after she slammed a car door on her finger, reports pain to the area.  X-ray then was negative for any acute fracture.  Patient then developed a posttraumatic paronychia.  She arrived in the ED with increased swelling, discoloration along with fluctuance noted.  During evaluation patient is neurovascular intact, does describe a throbbing sensation which she has been taking Tylenol for.  Vitals are within normal limits, she is afebrile.  I have personally drained paronychia on patient's right index finger, amount of copious drainage was released.  She tolerated the procedure adequately.  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  She was also placed on a short course of Keflex to help with her symptoms. Patient understands and agrees with management, discharged from the ED in stable condition.    Portions of this note were generated with Scientist, clinical (histocompatibility and immunogenetics). Dictation errors may occur despite best attempts at proofreading.  Final Clinical Impression(s) / ED Diagnoses Final diagnoses:  Paronychia of right index finger    Rx / DC Orders ED Discharge Orders         Ordered    cephALEXin (KEFLEX) 500 MG capsule  3 times daily     04/03/19 2039     oxyCODONE-acetaminophen (PERCOCET/ROXICET) 5-325 MG tablet  Every 4 hours PRN     04/03/19 2039           Claude Manges, Cordelia Poche 04/03/19 2042    Eber Hong, MD 04/05/19 2251

## 2019-04-03 NOTE — ED Notes (Signed)
Patient verbalizes understanding of discharge instructions. Opportunity for questioning and answers were provided. Armband removed by staff, pt discharged from ED ambulatory.   

## 2019-04-03 NOTE — ED Triage Notes (Signed)
Pt states struck R index finger in door 4 days ago.  C.o increased redness, discoloration and serous drainage noted from nail.

## 2019-04-11 ENCOUNTER — Encounter (HOSPITAL_COMMUNITY): Payer: Self-pay | Admitting: Emergency Medicine

## 2019-04-11 ENCOUNTER — Other Ambulatory Visit: Payer: Self-pay

## 2019-04-11 ENCOUNTER — Emergency Department (HOSPITAL_COMMUNITY)
Admission: EM | Admit: 2019-04-11 | Discharge: 2019-04-11 | Disposition: A | Discharge status: Home / Self Care | Payer: Medicaid Other | Attending: Emergency Medicine | Admitting: Emergency Medicine

## 2019-04-11 DIAGNOSIS — N3001 Acute cystitis with hematuria: Secondary | ICD-10-CM | POA: Insufficient documentation

## 2019-04-11 DIAGNOSIS — N760 Acute vaginitis: Secondary | ICD-10-CM | POA: Insufficient documentation

## 2019-04-11 DIAGNOSIS — L089 Local infection of the skin and subcutaneous tissue, unspecified: Secondary | ICD-10-CM | POA: Insufficient documentation

## 2019-04-11 LAB — URINALYSIS, ROUTINE W REFLEX MICROSCOPIC
Bilirubin Urine: NEGATIVE
Glucose, UA: NEGATIVE mg/dL
Ketones, ur: NEGATIVE mg/dL
Nitrite: NEGATIVE
Protein, ur: NEGATIVE mg/dL
Specific Gravity, Urine: 1.011 (ref 1.005–1.030)
pH: 6 (ref 5.0–8.0)

## 2019-04-11 LAB — WET PREP, GENITAL
Clue Cells Wet Prep HPF POC: NONE SEEN
Sperm: NONE SEEN
Trich, Wet Prep: NONE SEEN
Yeast Wet Prep HPF POC: NONE SEEN

## 2019-04-11 LAB — RAPID HIV SCREEN (HIV 1/2 AB+AG)
HIV 1/2 Antibodies: NONREACTIVE
HIV-1 P24 Antigen - HIV24: NONREACTIVE

## 2019-04-11 LAB — I-STAT BETA HCG BLOOD, ED (MC, WL, AP ONLY): I-stat hCG, quantitative: <5 m[IU]/mL (ref <5)

## 2019-04-11 MED ORDER — SULFAMETHOXAZOLE-TRIMETHOPRIM 800-160 MG PO TABS: 1.0000 | ORAL_TABLET | Freq: Two times a day (BID) | ORAL | 0 refills | Status: AC

## 2019-04-11 MED ORDER — DOXYCYCLINE HYCLATE 100 MG PO CAPS: 100.0000 mg | ORAL_CAPSULE | Freq: Two times a day (BID) | ORAL | 0 refills | Status: DC

## 2019-04-11 MED ORDER — IBUPROFEN 800 MG PO TABS
800.0000 mg | ORAL_TABLET | Freq: Once | ORAL | Status: AC
  Administered 2019-04-11: 15:00:00 800 mg via ORAL
  Filled 2019-04-11: qty 1

## 2019-04-11 MED ORDER — NAPROXEN 500 MG PO TABS: 500.0000 mg | ORAL_TABLET | Freq: Two times a day (BID) | ORAL | 0 refills | Status: DC

## 2019-04-11 NOTE — ED Provider Notes (Signed)
[facility] EMERGENCY DEPARTMENT Provider Note   CSN: [id number] Arrival date & time: 04/11/19  1312     History Chief Complaint  Patient presents with  . Finger Injury    Jaime Frost is a 39 y.o. female with no significant past medical history who presents to the ED due to persistent right index pain.  She has been seen 2 times in the ED for the same complaint.  Patient slammed her finger in a door on 3/13 reported to the ER with negative x-rays for bony fractures.  She then returned to the ED on 3/16 due to posttraumatic paronychia in which her finger was drained and she was treated with Keflex.  Patient admits to persistent pain, worse with movement.  Denies fever and chills.  She also expresses concerns about her fingernail falling off.  She admits to being compliant with Keflex.  She admits to full range of motion of her right index finger.  Also admits to continued bleeding and drainage from the site.  Patient also admits to increased vaginal pruritus after finishing Keflex with increased vaginal discharge.  Patient admits to brown/white vaginal discharge associated with intermittent abdominal cramping.  She has not been sexually active for the past 3 to 4 months with no concern for STDs at this time.  Admits to intermittent dysuria.  Denies back and flank pain.  Denies fever and chills.  She has a history of chlamydia in the past.  History obtained from patient and past medical records. No interpreter used during encounter.      Past Medical History:  Diagnosis Date  . Chlamydia   . UTI (urinary tract infection)     Patient Active Problem List   Diagnosis Date Noted  . Spontaneous vaginal delivery 02/04/2017  . Indication for care in labor or delivery 02/03/2017  . CONSTIPATION 05/26/2009  . CANDIDIASIS OF VULVA AND VAGINA 01/03/2008  . BACTERIAL VAGINITIS 11/16/2007  . VAGINAL DISCHARGE 11/16/2007  . PAP SMEAR, LGSIL, ABNORMAL 09/27/2007  . HPV  08/18/2007  . MICROSCOPIC HEMATURIA 08/18/2007    Past Surgical History:  Procedure Laterality Date  . NO PAST SURGERIES       OB History    Gravida  1   Para  1   Term  1   Preterm      AB      Living  1     SAB      TAB      Ectopic      Multiple  0   Live Births  1           Family History  Problem Relation Age of Onset  . Diabetes Mother   . Hypertension Mother   . Heart disease Maternal Grandmother   . Heart disease Maternal Grandfather     Social History   Tobacco Use  . Smoking status: Never Smoker  . Smokeless tobacco: Never Used  Substance Use Topics  . Alcohol use: Not Currently  . Drug use: No    Home Medications Prior to Admission medications   Medication Sig Start Date End Date Taking? Authorizing Provider  acetaminophen (TYLENOL) 500 MG tablet Take 1 tablet (500 mg total) by mouth every 6 (six) hours as needed. Patient taking differently: Take 500 mg by mouth every 6 (six) hours as needed for mild pain.  09/18/18   Lamptey, Britta Mccreedy, MD  bisacodyl (DULCOLAX) 5 MG EC tablet Take 1 tablet (5 mg total) by mouth 2 (  two) times daily. Patient not taking: Reported on 04/03/2019 08/12/17   Poe, Deirdre C, CNM  cyclobenzaprine (FLEXERIL) 10 MG tablet Take 1 tablet (10 mg total) by mouth 3 (three) times daily as needed for muscle spasms. Patient not taking: Reported on 04/03/2019 09/18/18   Chase Picket, MD  docusate sodium (COLACE) 100 MG capsule Take 1 capsule (100 mg total) by mouth 2 (two) times daily as needed. Patient not taking: Reported on 04/03/2019 08/12/17   Poe, Deirdre C, CNM  ibuprofen (ADVIL) 200 MG tablet Take 200 mg by mouth every 6 (six) hours as needed for moderate pain.     [provider]  naproxen (NAPROSYN) 375 MG tablet Take 1 tablet (375 mg total) by mouth 2 (two) times daily. Patient not taking: Reported on 04/03/2019 09/18/18   Chase Picket, MD  naproxen (NAPROSYN) 500 MG tablet Take 1 tablet (500 mg total)  by mouth 2 (two) times daily. 04/11/19   Suzy Bouchard, PA-C  oxyCODONE-acetaminophen (PERCOCET/ROXICET) 5-325 MG tablet Take 1 tablet by mouth every 4 (four) hours as needed for severe pain. 04/03/19   Janeece Fitting, PA-C  sulfamethoxazole-trimethoprim (BACTRIM DS) 800-160 MG tablet Take 1 tablet by mouth 2 (two) times daily for 7 days. 04/11/19 04/18/19  Suzy Bouchard, PA-C  triamcinolone cream (KENALOG) 0.1 % Apply 1 application topically 2 (two) times daily. Patient not taking: Reported on 04/03/2019 10/11/17   Sherwood Gambler, MD    Allergies    Patient has no known allergies.  Review of Systems   Review of Systems  Constitutional: Negative for chills and fever.  Gastrointestinal: Positive for abdominal pain (cramping). Negative for diarrhea, nausea and vomiting.  Genitourinary: Positive for dysuria, vaginal discharge and vaginal pain (pruritis). Negative for difficulty urinating, genital sores, hematuria, pelvic pain and vaginal bleeding.  Musculoskeletal: Positive for arthralgias (right index finger).  Skin: Positive for color change and wound.  Neurological: Negative for numbness.  All other systems reviewed and are negative.   Physical Exam Updated Vital Signs BP 102/67 (BP Location: Left Arm)   Pulse 96   Temp 98.8 F (37.1 C) (Oral)   Resp 16   Ht 5' (1.524 m)   Wt 63.5 kg   SpO2 100%   BMI 27.34 kg/m   Physical Exam Vitals and nursing note reviewed. Exam conducted with a chaperone present.  Constitutional:      General: She is not in acute distress.    Appearance: She is not ill-appearing.  HENT:     Head: Normocephalic.  Eyes:     Pupils: Pupils are equal, round, and reactive to light.  Cardiovascular:     Rate and Rhythm: Normal rate and regular rhythm.     Pulses: Normal pulses.     Heart sounds: Normal heart sounds. No murmur. No friction rub. No gallop.   Pulmonary:     Effort: Pulmonary effort is normal.     Breath sounds: Normal breath sounds.    Abdominal:     General: Abdomen is flat. There is no distension.     Palpations: Abdomen is soft.     Tenderness: There is no abdominal tenderness. There is no guarding or rebound.     Comments: Abdomen soft, nondistended, nontender to palpation in all quadrants without guarding or peritoneal signs. No rebound.   Genitourinary:    Exam position: Supine.     Labia:        Right: No rash or tenderness.  Left: No rash or tenderness.      Vagina: Vaginal discharge present.     Cervix: Discharge present. No cervical motion tenderness.     Uterus: Normal.      Adnexa: Right adnexa normal and left adnexa normal.       Right: No tenderness.         Left: No tenderness.       Comments: Normal external female genitalia.  Copious amount of white/greenish vaginal discharge.  No CMT.  No adnexal tenderness or masses. Musculoskeletal:     Cervical back: Neck supple.     Comments: Full range of motion of right index finger.  Tenderness to palpation surrounding nailbed with purulent drainage and mild erythema.  Capillary refill less than 2.  Radial pulse intact.  Range of motion of all other fingers and wrist.  Skin:    Comments: Right index finger nailbed with purulent drainage and surrounding erythema.  See photo below.  Neurological:     General: No focal deficit present.     Mental Status: She is alert.       ED Results / Procedures / Treatments   Labs (all labs ordered are listed, but only abnormal results are displayed) Labs Reviewed  WET PREP, GENITAL - Abnormal; Notable for the following components:      Result Value   WBC, Wet Prep HPF POC MANY (*)    All other components within normal limits  URINALYSIS, ROUTINE W REFLEX MICROSCOPIC - Abnormal; Notable for the following components:   Color, Urine STRAW (*)    Hgb urine dipstick SMALL (*)    Leukocytes,Ua LARGE (*)    Bacteria, UA RARE (*)    All other components within normal limits  AEROBIC CULTURE (SUPERFICIAL  SPECIMEN)  URINE CULTURE  RAPID HIV SCREEN (HIV 1/2 AB+AG)  RPR  I-STAT BETA HCG BLOOD, ED ([facility], [facility], [facility] ONLY)  GC/CHLAMYDIA PROBE AMP ([place]) NOT AT [facility]    EKG None  Radiology No results found.  Procedures Procedures (including critical care time)  Medications Ordered in ED Medications  ibuprofen (ADVIL) tablet 800 mg (800 mg Oral Given 04/11/19 1440)    ED Course  I have reviewed the triage vital signs and the nursing notes.  Pertinent labs & imaging results that were available during my care of the patient were reviewed by me and considered in my medical decision making (see chart for details).  Clinical Course as of Apr 10 1608  Wed Apr 11, 2019  1519 WBC, Wet Prep HPF POC(!): MANY [CA]  1546 Leukocytes,Ua(!): LARGE [CA]  1557 Bacteria, UA(!): RARE [CA]  1557 I-stat hCG, quantitative: <5.0 [CA]    Clinical Course User Index [CA] Jesusita Oka   MDM Rules/Calculators/A&P                     39 year old female presents to the ED due to 2 complaints.  First she admits persistent right index finger pain after recent injury on 3/13 with negative x-ray complicated by traumatic paronychia which was drained and treated with antibiotic trial of Keflex on 3/16.  Patient also admits to vaginal pruritus with increased vaginal discharge. Vitals all within normal limits. Patient is afebrile. Patient no acute distress and non-ill-appearing. Full range of motion of right index finger.  Tenderness to palpation surrounding nailbed with purulent drainage and mild erythema.  Capillary refill less than 2.  Radial pulse intact.  Range of motion of all other fingers and  wrist. No concern for tenosynovitis at this time. No concern for sepsis at this time. Pelvic exam reassuring with white/greenish vaginal discharge. No CMT. Doubt PID.  Wet prep positive for white blood cells, but otherwise unremarkable.  Gonorrhea/Chlamydia cultures pending.  Patient wishes to hold off on  treatment until results become available given she has not been sexually active in numerous months.  UA positive for large leukocytes, rare bacteria, and small hematuria. Shared decision making between patient and myself and she would like to be treated for UTI at this time. Wound culture pending.  Will discharge patient with Bactrim to cover for both skin infection and UTI. Urine culture pending.  Advised patient to continue to use warm compresses.  No further I&D warranted at this time given wound is actively draining.  Will also discharge patient with naproxen as needed for pain. Strict ED precautions discussed with patient. Patient states understanding and agrees to plan. Patient discharged home in no acute distress and stable vitals  Final Clinical Impression(s) / ED Diagnoses Final diagnoses:  Finger infection  Acute vaginitis  Acute cystitis with hematuria    Rx / DC Orders ED Discharge Orders         Ordered    naproxen (NAPROSYN) 500 MG tablet  2 times daily     04/11/19 1549    doxycycline (VIBRAMYCIN) 100 MG capsule  2 times daily,   Status:  Discontinued     04/11/19 1549    sulfamethoxazole-trimethoprim (BACTRIM DS) 800-160 MG tablet  2 times daily     04/11/19 1604           Jesusita Oka 04/11/19 1610    Charlynne Pander, MD 04/12/19 [phone]

## 2019-04-11 NOTE — ED Notes (Signed)
Specimen cup provided, pt encouraged to provide urine per MD order.  

## 2019-04-11 NOTE — ED Notes (Signed)
Pt d/c home per MD order. Discharge summary reviewed with pt, pt verbalizes understanding. Ambulatory off unit. No s/s of acute distress noted.  

## 2019-04-11 NOTE — ED Triage Notes (Signed)
Pt had right index finger injury over 1 week ago, following up today due to swelling and fear of nail coming off.

## 2019-04-11 NOTE — Discharge Instructions (Addendum)
As discussed, your urine was concerning for a urinary tract infection.  I am sending you home with an antibiotic that will cover for both the UTI and the skin infection.  I am also sending home with pain medication called naproxen.  You can take it twice a day as needed for pain.  Continue to use warm compresses 3-4 times a day on your finger to allow for full drainage.  Keep area clean.  If symptoms do not improve within the next week call [facility] to schedule a wound recheck at the number provided.  Return to the ER for new or worsening symptoms.

## 2019-04-12 LAB — URINE CULTURE: Culture: NO GROWTH

## 2019-04-12 LAB — GC/CHLAMYDIA PROBE AMP (~~LOC~~) NOT AT ARMC
Chlamydia: NEGATIVE
Neisseria Gonorrhea: NEGATIVE

## 2019-04-12 LAB — RPR: RPR Ser Ql: NONREACTIVE

## 2019-04-13 LAB — AEROBIC CULTURE W GRAM STAIN (SUPERFICIAL SPECIMEN)
Gram Stain: NONE SEEN
Special Requests: NORMAL

## 2020-01-28 ENCOUNTER — Other Ambulatory Visit: Payer: Medicaid Other

## 2020-01-28 ENCOUNTER — Other Ambulatory Visit: Payer: Self-pay

## 2020-01-28 DIAGNOSIS — Z20822 Contact with and (suspected) exposure to covid-19: Secondary | ICD-10-CM

## 2020-01-30 LAB — NOVEL CORONAVIRUS, NAA: SARS-CoV-2, NAA: NOT DETECTED

## 2020-01-30 LAB — SARS-COV-2, NAA 2 DAY TAT

## 2020-01-31 ENCOUNTER — Telehealth: Payer: Self-pay | Admitting: *Deleted

## 2020-01-31 NOTE — Telephone Encounter (Signed)
Pt notified of negative COVID-19 results. Understanding verbalized.   

## 2020-11-10 ENCOUNTER — Emergency Department (HOSPITAL_COMMUNITY)
Admission: EM | Admit: 2020-11-10 | Discharge: 2020-11-10 | Disposition: A | Discharge status: Home / Self Care | Payer: 59 | Attending: Emergency Medicine | Admitting: Emergency Medicine

## 2020-11-10 ENCOUNTER — Encounter (HOSPITAL_COMMUNITY): Payer: Self-pay | Admitting: Emergency Medicine

## 2020-11-10 DIAGNOSIS — U071 COVID-19: Secondary | ICD-10-CM | POA: Diagnosis present

## 2020-11-10 NOTE — ED Provider Notes (Signed)
[facility] EMERGENCY DEPARTMENT Provider Note   CSN: [id number] Arrival date & time: 11/10/20  1208     History Chief Complaint  Patient presents with   covid +    Jaime Frost is a 40 y.o. female who presents with concern for persistent COVID-19 symptoms.  Initially began having symptoms on 10/19 and tested positive on 10/21.  Presents today requesting repeat COVID test.  Continues to have congestion, sore throat, nonproductive cough, body aches , now with 3 episodes of loose stool today without melena or hematochezia.  I personally reviewed this patient's medical records.  She has history of HPV and sexually-transmitted infections.  She is not on any medications every day.  HPI     Past Medical History:  Diagnosis Date   Chlamydia    UTI (urinary tract infection)     Patient Active Problem List   Diagnosis Date Noted   Spontaneous vaginal delivery 02/04/2017   Indication for care in labor or delivery 02/03/2017   CONSTIPATION 05/26/2009   CANDIDIASIS OF VULVA AND VAGINA 01/03/2008   BACTERIAL VAGINITIS 11/16/2007   VAGINAL DISCHARGE 11/16/2007   PAP SMEAR, LGSIL, ABNORMAL 09/27/2007   HPV 08/18/2007   MICROSCOPIC HEMATURIA 08/18/2007    Past Surgical History:  Procedure Laterality Date   NO PAST SURGERIES       OB History     Gravida  1   Para  1   Term  1   Preterm      AB      Living  1      SAB      IAB      Ectopic      Multiple  0   Live Births  1           Family History  Problem Relation Age of Onset   Diabetes Mother    Hypertension Mother    Heart disease Maternal Grandmother    Heart disease Maternal Grandfather     Social History   Tobacco Use   Smoking status: Never   Smokeless tobacco: Never  Vaping Use   Vaping Use: Never used  Substance Use Topics   Alcohol use: Not Currently   Drug use: No    Home Medications Prior to Admission medications   Medication Sig Start Date End Date  Taking? Authorizing Provider  acetaminophen (TYLENOL) 500 MG tablet Take 1 tablet (500 mg total) by mouth every 6 (six) hours as needed. Patient taking differently: Take 500 mg by mouth every 6 (six) hours as needed for mild pain.  09/18/18   Lamptey, Britta Mccreedy, MD  bisacodyl (DULCOLAX) 5 MG EC tablet Take 1 tablet (5 mg total) by mouth 2 (two) times daily. Patient not taking: Reported on 04/03/2019 08/12/17   Poe, Deirdre C, CNM  cyclobenzaprine (FLEXERIL) 10 MG tablet Take 1 tablet (10 mg total) by mouth 3 (three) times daily as needed for muscle spasms. Patient not taking: Reported on 04/03/2019 09/18/18   Merrilee Jansky, MD  docusate sodium (COLACE) 100 MG capsule Take 1 capsule (100 mg total) by mouth 2 (two) times daily as needed. Patient not taking: Reported on 04/03/2019 08/12/17   Poe, Deirdre C, CNM  ibuprofen (ADVIL) 200 MG tablet Take 200 mg by mouth every 6 (six) hours as needed for moderate pain.     [provider]  naproxen (NAPROSYN) 375 MG tablet Take 1 tablet (375 mg total) by mouth 2 (two) times daily. Patient not taking:  Reported on 04/03/2019 09/18/18   Merrilee Jansky, MD  naproxen (NAPROSYN) 500 MG tablet Take 1 tablet (500 mg total) by mouth 2 (two) times daily. 04/11/19   Mannie Stabile, PA-C  oxyCODONE-acetaminophen (PERCOCET/ROXICET) 5-325 MG tablet Take 1 tablet by mouth every 4 (four) hours as needed for severe pain. 04/03/19   Claude Manges, PA-C  triamcinolone cream (KENALOG) 0.1 % Apply 1 application topically 2 (two) times daily. Patient not taking: Reported on 04/03/2019 10/11/17   Pricilla Loveless, MD    Allergies    Patient has no known allergies.  Review of Systems   Review of Systems  Constitutional:  Positive for activity change, appetite change, chills and fatigue. Negative for fever.  HENT:  Positive for congestion, rhinorrhea, sinus pressure, sinus pain and sore throat. Negative for trouble swallowing and voice change.   Eyes: Negative.    Respiratory:  Positive for cough. Negative for choking, chest tightness and shortness of breath.   Cardiovascular: Negative.   Gastrointestinal:  Positive for abdominal pain and diarrhea. Negative for nausea and vomiting.  Genitourinary: Negative.   Musculoskeletal:  Positive for myalgias.  Neurological:  Positive for headaches. Negative for dizziness, weakness and light-headedness.   Physical Exam Updated Vital Signs BP (!) 130/109   Pulse 96   Temp 99.4 F (37.4 C) (Oral)   Resp 16   SpO2 100%   Physical Exam Vitals and nursing note reviewed.  Constitutional:      Appearance: Normal appearance. She is not ill-appearing or toxic-appearing.  HENT:     Head: Normocephalic and atraumatic.     Nose: Nose normal. No congestion.     Mouth/Throat:     Mouth: Mucous membranes are moist.     Pharynx: Oropharynx is clear. Uvula midline. Posterior oropharyngeal erythema present. No oropharyngeal exudate.     Tonsils: No tonsillar exudate or tonsillar abscesses. 1+ on the right. 1+ on the left.  Eyes:     General: Lids are normal. Vision grossly intact.        Right eye: No discharge.        Left eye: No discharge.     Extraocular Movements: Extraocular movements intact.     Conjunctiva/sclera: Conjunctivae normal.     Pupils: Pupils are equal, round, and reactive to light.  Neck:     Trachea: Trachea and phonation normal.  Cardiovascular:     Rate and Rhythm: Normal rate and regular rhythm.     Pulses: Normal pulses.     Heart sounds: Normal heart sounds. No murmur heard. Pulmonary:     Effort: Pulmonary effort is normal. No tachypnea, bradypnea, accessory muscle usage, prolonged expiration, respiratory distress or retractions.     Breath sounds: Normal breath sounds. No wheezing or rales.  Chest:     Chest wall: No mass, lacerations, deformity, swelling, tenderness, crepitus or edema. There is no dullness to percussion.  Abdominal:     General: Bowel sounds are normal. There is  no distension.     Palpations: Abdomen is soft.     Tenderness: There is no abdominal tenderness. There is no right CVA tenderness, left CVA tenderness, guarding or rebound.  Musculoskeletal:        General: No deformity.     Cervical back: Normal range of motion and neck supple. No edema, rigidity or crepitus. No pain with movement, spinous process tenderness or muscular tenderness.     Right lower leg: No edema.     Left lower leg: No edema.  Lymphadenopathy:     Cervical: Cervical adenopathy present.     Right cervical: Superficial cervical adenopathy present.     Left cervical: Superficial cervical adenopathy present.  Skin:    General: Skin is warm and dry.     Capillary Refill: Capillary refill takes less than 2 seconds.  Neurological:     General: No focal deficit present.     Mental Status: She is alert and oriented to person, place, and time. Mental status is at baseline.  Psychiatric:        Mood and Affect: Mood normal.    ED Results / Procedures / Treatments   Labs (all labs ordered are listed, but only abnormal results are displayed) Labs Reviewed - No data to display  EKG None  Radiology No results found.  Procedures Procedures   Medications Ordered in ED Medications - No data to display  ED Course  I have reviewed the triage vital signs and the nursing notes.  Pertinent labs & imaging results that were available during my care of the patient were reviewed by me and considered in my medical decision making (see chart for details).    MDM Rules/Calculators/A&P                         40 year old female presents with concern for persistent COVID-19 infection 5 days after onset of symptoms.  She was vaccinated x2 against COVID-19.  Hypertensive, otherwise vital signs are normal intake.  Cardiopulmonary exam is normal, abdominal exam is benign.  Patient is neurovascular intact in all 4 extremities.  HEENT exam revealed mild posterior effusion erythema  without exudate.  Additionally has shotty cervical lymphadenopathy bilaterally.  Reassuring physical exam and vital signs, no further work-up warranted.  This time.  Will not offer repeat test given patient was tested +3 days ago.  Will offer work note.  May continue with OTC symptom management.  Ammanda voiced understanding of her medical evaluation and treatment plan.  Each of her questions was answered to her expressed satisfaction.  Return precautions were given.  Patient is well-appearing, stable, and appropriate for discharge at this time.  This chart was dictated using voice recognition software, Dragon. Despite the best efforts of this provider to proofread and correct errors, errors may still occur which can change documentation meaning.  Jaime Frost was evaluated in Emergency Department on 11/10/2020 for the symptoms described in the history of present illness. She was evaluated in the context of the global COVID-19 pandemic, which necessitated consideration that the patient might be at risk for infection with the SARS-CoV-2 virus that causes COVID-19. Institutional protocols and algorithms that pertain to the evaluation of patients at risk for COVID-19 are in a state of rapid change based on information released by regulatory bodies including the CDC and federal and state organizations. These policies and algorithms were followed during the patient's care in the ED.   Final Clinical Impression(s) / ED Diagnoses Final diagnoses:  COVID-19    Rx / DC Orders ED Discharge Orders     None        Sherrilee Gilles 11/10/20 1307    Mancel Bale, MD 11/10/20 1807

## 2020-11-10 NOTE — ED Triage Notes (Signed)
Pt reports being covid + since Friday. Pt endorses sore throat, cough, mild diarrhea. Pt requesting another covid test.

## 2020-11-10 NOTE — Discharge Instructions (Addendum)
Your physical exam and vital signs were very reassuring. You may continue to take over the counter medications for your symptoms. Please increase your hydration and return to the ER with any new severe symptoms.

## 2020-12-05 ENCOUNTER — Ambulatory Visit (INDEPENDENT_AMBULATORY_CARE_PROVIDER_SITE_OTHER): Payer: 59 | Admitting: Physician Assistant

## 2020-12-05 ENCOUNTER — Other Ambulatory Visit: Payer: Self-pay | Admitting: Physician Assistant

## 2020-12-05 ENCOUNTER — Encounter: Payer: Self-pay | Admitting: Physician Assistant

## 2020-12-05 ENCOUNTER — Other Ambulatory Visit: Payer: Self-pay

## 2020-12-05 VITALS — BP 90/60 | HR 83 | Temp 97.9°F | Ht 63.0 in | Wt 140.2 lb

## 2020-12-05 DIAGNOSIS — M545 Low back pain, unspecified: Secondary | ICD-10-CM

## 2020-12-05 DIAGNOSIS — R600 Localized edema: Secondary | ICD-10-CM

## 2020-12-05 DIAGNOSIS — R21 Rash and other nonspecific skin eruption: Secondary | ICD-10-CM | POA: Diagnosis not present

## 2020-12-05 DIAGNOSIS — G8929 Other chronic pain: Secondary | ICD-10-CM

## 2020-12-05 DIAGNOSIS — Z131 Encounter for screening for diabetes mellitus: Secondary | ICD-10-CM | POA: Diagnosis not present

## 2020-12-05 DIAGNOSIS — Z13220 Encounter for screening for lipoid disorders: Secondary | ICD-10-CM | POA: Diagnosis not present

## 2020-12-05 DIAGNOSIS — Z1329 Encounter for screening for other suspected endocrine disorder: Secondary | ICD-10-CM

## 2020-12-05 LAB — CBC WITH DIFFERENTIAL/PLATELET
Basophils Absolute: 0 10*3/uL (ref 0.0–0.1)
Basophils Relative: 0.3 % (ref 0.0–3.0)
Eosinophils Absolute: 0.1 10*3/uL (ref 0.0–0.7)
Eosinophils Relative: 1.4 % (ref 0.0–5.0)
HCT: 36.4 % (ref 36.0–46.0)
Hemoglobin: 11.9 g/dL — ABNORMAL LOW (ref 12.0–15.0)
Lymphocytes Relative: 33.2 % (ref 12.0–46.0)
Lymphs Abs: 2.7 10*3/uL (ref 0.7–4.0)
MCHC: 32.6 g/dL (ref 30.0–36.0)
MCV: 95.9 fl (ref 78.0–100.0)
Monocytes Absolute: 0.5 10*3/uL (ref 0.1–1.0)
Monocytes Relative: 5.8 % (ref 3.0–12.0)
Neutro Abs: 4.7 10*3/uL (ref 1.4–7.7)
Neutrophils Relative %: 59.3 % (ref 43.0–77.0)
Platelets: 241 10*3/uL (ref 150.0–400.0)
RBC: 3.79 Mil/uL — ABNORMAL LOW (ref 3.87–5.11)
RDW: 14.1 % (ref 11.5–15.5)
WBC: 8 10*3/uL (ref 4.0–10.5)

## 2020-12-05 LAB — COMPREHENSIVE METABOLIC PANEL WITH GFR
ALT: 10 U/L (ref 0–35)
AST: 14 U/L (ref 0–37)
Albumin: 4.3 g/dL (ref 3.5–5.2)
Alkaline Phosphatase: 70 U/L (ref 39–117)
BUN: 10 mg/dL (ref 6–23)
CO2: 26 meq/L (ref 19–32)
Calcium: 9.4 mg/dL (ref 8.4–10.5)
Chloride: 104 meq/L (ref 96–112)
Creatinine, Ser: 0.76 mg/dL (ref 0.40–1.20)
GFR: 98.04 mL/min
Glucose, Bld: 79 mg/dL (ref 70–99)
Potassium: 3.8 meq/L (ref 3.5–5.1)
Sodium: 137 meq/L (ref 135–145)
Total Bilirubin: 0.7 mg/dL (ref 0.2–1.2)
Total Protein: 7.1 g/dL (ref 6.0–8.3)

## 2020-12-05 LAB — LIPID PANEL
Cholesterol: 180 mg/dL (ref 0–200)
HDL: 59.7 mg/dL
LDL Cholesterol: 101 mg/dL — ABNORMAL HIGH (ref 0–99)
NonHDL: 120.09
Total CHOL/HDL Ratio: 3
Triglycerides: 97 mg/dL (ref 0.0–149.0)
VLDL: 19.4 mg/dL (ref 0.0–40.0)

## 2020-12-05 LAB — TSH: TSH: 0.82 u[IU]/mL (ref 0.35–5.50)

## 2020-12-05 MED ORDER — TRIAMCINOLONE ACETONIDE 0.1 % EX CREA: 1.0000 "application " | TOPICAL_CREAM | Freq: Two times a day (BID) | CUTANEOUS | 0 refills | Status: DC

## 2020-12-05 NOTE — Progress Notes (Signed)
Subjective:    Patient ID: Jaime Frost, female    DOB: September 10, 1980, 40 y.o.   MRN: [id number]  Chief Complaint  Patient presents with   Establish Care   Back Pain    Pt c/o low back pain x 3-4 months. She is using Tylenol and Ibuprofen for pain.   Rash    Pt is c/o rash on both feet for about 3 months, itching. She tried alcohol and peroxide.     HPI 40 y.o. patient presents today for new patient establishment with me.  Pt has not had PCP in awhile. Works in a daycare center.   Current Care Team: [facility]    Acute Concerns: Itchy rash bilateral feet. Started about 3 years ago during pregnancy with her son. Left foot worse than right. Alcohol, peroxide, still having itching. No prescription strength creams. No pain or numbness in her feet. No personal hx of skin conditions.   Low back pain x 3-4 months. Tylenol / ibuprofen as needed. No numbness or tingling, no radiation down legs. No saddle paresthesia. No fevers or chills. No personal hx of cancer. Feels like pain is exacerbated by lifting toddlers at daycare and her heavy 40 yo son.   Chronic Concerns: None     Past Medical History:  Diagnosis Date   Chlamydia    UTI (urinary tract infection)    Vaginal delivery 01/2017    Past Surgical History:  Procedure Laterality Date   fractured ankle Left 03/2001   NO PAST SURGERIES      Family History  Problem Relation Age of Onset   Diabetes Mother    Hypertension Mother    Heart disease Maternal Grandmother    Heart disease Maternal Grandfather     Social History   Tobacco Use   Smoking status: Never   Smokeless tobacco: Never  Vaping Use   Vaping Use: Never used  Substance Use Topics   Alcohol use: Not Currently   Drug use: Not Currently    Types: Marijuana     No Known Allergies  Review of Systems NEGATIVE UNLESS OTHERWISE INDICATED IN HPI      Objective:     BP 90/60 (BP Location: Left Arm, Patient Position: Sitting, Cuff Size: Normal)    Pulse 83   Temp 97.9 F (36.6 C) (Temporal)   Ht 5\' 3"  (1.6 m)   Wt 140 lb 4 oz (63.6 kg)   LMP 11/20/2020   SpO2 97%   Breastfeeding No   BMI 24.84 kg/m   Wt Readings from Last 3 Encounters:  12/05/20 140 lb 4 oz (63.6 kg)  04/11/19 140 lb (63.5 kg)  04/03/19 145 lb (65.8 kg)    BP Readings from Last 3 Encounters:  12/05/20 90/60  11/10/20 (!) 130/109  04/11/19 103/64     Physical Exam Vitals and nursing note reviewed.  Constitutional:      Appearance: Normal appearance. She is normal weight. She is not toxic-appearing.  HENT:     Head: Normocephalic and atraumatic.     Right Ear: External ear normal.     Left Ear: External ear normal.     Nose: Nose normal.     Mouth/Throat:     Mouth: Mucous membranes are moist.  Eyes:     Extraocular Movements: Extraocular movements intact.     Conjunctiva/sclera: Conjunctivae normal.     Pupils: Pupils are equal, round, and reactive to light.  Cardiovascular:     Rate and Rhythm: Normal rate and  regular rhythm.     Pulses: Normal pulses.     Heart sounds: Normal heart sounds. No murmur heard. Pulmonary:     Effort: Pulmonary effort is normal.     Breath sounds: Normal breath sounds.  Musculoskeletal:        General: Normal range of motion.     Cervical back: Normal range of motion and neck supple.     Right lower leg: 1+ Edema present.     Left lower leg: 1+ Edema present.  Skin:    General: Skin is warm and dry.     Comments: Dorsum of left foot - dryness & multiple macular darkened areas similar to itch-rash cycle appearance   Neurological:     General: No focal deficit present.     Mental Status: She is alert and oriented to person, place, and time.  Psychiatric:        Mood and Affect: Mood normal.        Behavior: Behavior normal.        Thought Content: Thought content normal.        Judgment: Judgment normal.       Assessment & Plan:   Problem List Items Addressed This Visit   None Visit Diagnoses      Rash and nonspecific skin eruption    -  Primary   Relevant Orders   CBC with Differential/Platelet (Completed)   Comprehensive metabolic panel (Completed)   TSH (Completed)   Chronic bilateral low back pain without sciatica       Relevant Orders   DG Lumbar Spine Complete   Lower leg edema       Relevant Orders   CBC with Differential/Platelet (Completed)   Comprehensive metabolic panel (Completed)   TSH (Completed)   DG Lumbar Spine Complete   Diabetes mellitus screening       Relevant Orders   Comprehensive metabolic panel (Completed)   Screening for cholesterol level       Relevant Orders   Lipid panel (Completed)   Thyroid disorder screening       Relevant Orders   TSH (Completed)        Meds ordered this encounter  Medications   triamcinolone cream (KENALOG) 0.1 %    Sig: Apply 1 application topically 2 (two) times daily for 15 days.    Dispense:  80 g    Refill:  0    1. Rash and nonspecific skin eruption Left foot - appears to be chronic itch-rash. Will trial triamcinolone cream and keeping moisturized. Recheck in 4 weeks.  2. Chronic bilateral low back pain without sciatica No red flags. With ongoing hx, will plan for L-spine films. May need to trial Meloxicam pending normal renal function.  3. Lower leg edema Non-pitting, equal bilaterally. Advised limiting salt, drinking 64-80 oz water daily, compression stockings. Monitor closely.   4. Diabetes mellitus screening 5. Screening for cholesterol level 6. Thyroid disorder screening Labs today, treat pending results.   Mykel Sponaugle M Coraleigh Sheeran, PA-C

## 2020-12-05 NOTE — Patient Instructions (Addendum)
Good to meet you today! Please go to the lab for blood work and I will send results through MyChart. Try the cream for your feet as directed. Pending normal labs, I will send an Rx to help with your low back pain.   See you back in about 4 weeks to recheck!

## 2020-12-08 MED ORDER — IBUPROFEN 200 MG PO TABS: 200.0000 mg | ORAL_TABLET | Freq: Four times a day (QID) | ORAL | 2 refills | Status: DC | PRN

## 2020-12-15 ENCOUNTER — Encounter: Payer: Self-pay | Admitting: Physician Assistant

## 2020-12-16 ENCOUNTER — Other Ambulatory Visit: Payer: Self-pay | Admitting: *Deleted

## 2020-12-16 MED ORDER — MELOXICAM 7.5 MG PO TABS: 7.5000 mg | ORAL_TABLET | Freq: Every day | ORAL | 2 refills | Status: DC

## 2020-12-31 NOTE — Telephone Encounter (Signed)
Left voice message for patient to call clinic.  

## 2021-01-01 ENCOUNTER — Telehealth: Payer: Self-pay

## 2021-01-01 NOTE — Telephone Encounter (Signed)
Left voice message for patient to call clinic.  

## 2021-01-01 NOTE — Telephone Encounter (Signed)
Patient would like steroid called in she is extremely itchy from the meloxicam

## 2021-01-02 ENCOUNTER — Ambulatory Visit: Payer: 59 | Admitting: Physician Assistant

## 2021-01-02 NOTE — Telephone Encounter (Signed)
Jaime Frost called in returning phone call and she stated that she is okay with another prescription.

## 2021-01-06 NOTE — Telephone Encounter (Signed)
Left detailed message for patient to come in office to schedule a appt with pcp to receive a steroid injection not Rx.

## 2021-01-08 ENCOUNTER — Ambulatory Visit: Payer: 59 | Admitting: Family

## 2021-01-09 ENCOUNTER — Ambulatory Visit: Payer: 59 | Admitting: Family

## 2021-02-05 ENCOUNTER — Other Ambulatory Visit: Payer: Self-pay

## 2021-02-05 ENCOUNTER — Ambulatory Visit (INDEPENDENT_AMBULATORY_CARE_PROVIDER_SITE_OTHER): Payer: Self-pay | Admitting: Family

## 2021-02-05 ENCOUNTER — Other Ambulatory Visit (HOSPITAL_COMMUNITY)
Admission: RE | Admit: 2021-02-05 | Discharge: 2021-02-05 | Disposition: A | Discharge status: Home / Self Care | Payer: Medicaid Other | Source: Ambulatory Visit | Attending: Family | Admitting: Family

## 2021-02-05 ENCOUNTER — Encounter: Payer: Self-pay | Admitting: Family

## 2021-02-05 ENCOUNTER — Ambulatory Visit: Payer: Medicaid Other | Admitting: Physician Assistant

## 2021-02-05 VITALS — BP 89/51 | HR 91 | Temp 97.8°F | Ht 63.0 in | Wt 140.0 lb

## 2021-02-05 DIAGNOSIS — N898 Other specified noninflammatory disorders of vagina: Secondary | ICD-10-CM | POA: Diagnosis not present

## 2021-02-05 DIAGNOSIS — M545 Low back pain, unspecified: Secondary | ICD-10-CM

## 2021-02-05 MED ORDER — NAPROXEN 500 MG PO TABS: 500.0000 mg | ORAL_TABLET | Freq: Two times a day (BID) | ORAL | 0 refills | Status: DC

## 2021-02-05 NOTE — Assessment & Plan Note (Addendum)
reports starting Mobic for pain, but caused an allergic reaction so she stopped. She reports taking Advil and Aleve in the past and tolerated. Sending Naproxen, advised on use & SE, and using good body mechanics at work. Continue using heat application 2-3x/day. f/u with PCP.

## 2021-02-05 NOTE — Assessment & Plan Note (Signed)
vaginal swab specimen obtained, pt states she has had no sexual activity for 5 months.

## 2021-02-05 NOTE — Progress Notes (Signed)
Subjective:     Patient ID: Jaime Frost, female    DOB: [birth year], 41 y.o.   MRN: [id number]  Chief Complaint  Patient presents with   Vaginal Discharge   Back Pain    Xray ordered 12/05/2020. Not complete.    HPI: Pain She reports new onset lumbar pain. 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 The pain started a few weeks ago and is staying constant. The pain does not radiate. The pain is described as aching, soreness, and throbbing, is moderate in intensity, occurring intermittently. Symptoms are worse in the: afternoon, evening  Aggravating factors: bending forwards, standing, and lifting.  She has tried application of heat and NSAIDs with mild relief. Reports she had a itchy reaction to Meloxicam and stopped taking.  Vaginitis: Patient complains of an abnormal vaginal discharge for 1 week. Vaginal symptoms include odor, itching, pain. STI Risk/HX: positive hx, low current risk. Discharge described as:  thin, brown Other associated symptoms: pelvic pain Menstrual pattern: regular, due to start in another week. Contraception: none.   Health Maintenance Due  Topic Date Due   Hepatitis C Screening  Never done   PAP SMEAR-Modifier  05/26/2012    Past Medical History:  Diagnosis Date   Chlamydia    UTI (urinary tract infection)    Vaginal delivery 01/2017    Past Surgical History:  Procedure Laterality Date   fractured ankle Left 03/2001   NO PAST SURGERIES      Outpatient Medications Prior to Visit  Medication Sig Dispense Refill   acetaminophen (TYLENOL) 500 MG tablet Take 1 tablet (500 mg total) by mouth every 6 (six) hours as needed. (Patient taking differently: Take 500 mg by mouth every 6 (six) hours as needed for mild pain.) 30 tablet 0   ibuprofen (ADVIL) 200 MG tablet Take 1 tablet (200 mg total) by mouth every 6 (six) hours  as needed for moderate pain. 30 tablet 2   meloxicam (MOBIC) 7.5 MG tablet Take 1 tablet (7.5 mg total) by mouth daily. 30 tablet 2   No facility-administered medications prior to visit.    Allergies  Allergen Reactions   Meloxicam Hives        Objective:    Physical Exam Vitals and nursing note reviewed.  Constitutional:      Appearance: Normal appearance.  Cardiovascular:     Rate and Rhythm: Normal rate and regular rhythm.  Pulmonary:     Effort: Pulmonary effort is normal.     Breath sounds: Normal breath sounds.  Genitourinary:    Labia:        Right: No rash or lesion.        Left: No rash or lesion.      Vagina: Vaginal discharge (thick, white) present. No erythema, tenderness, bleeding or lesions.  Musculoskeletal:        General: Normal range of motion.  Skin:    General: Skin is warm and dry.  Neurological:     Mental Status: She is alert.  Psychiatric:        Mood and Affect: Mood normal.        Behavior: Behavior normal.    BP (!) 89/51    Pulse 91    Temp 97.8 F (36.6 C) (Temporal)    Ht 5\' 3"  (1.6 m)    Wt 140 lb (63.5 kg)  SpO2 100%    BMI 24.80 kg/m  Wt Readings from Last 3 Encounters:  02/05/21 140 lb (63.5 kg)  12/05/20 140 lb 4 oz (63.6 kg)  04/11/19 140 lb (63.5 kg)       Assessment & Plan:   Problem List Items Addressed This Visit       Other   Vaginal discharge    vaginal swab specimen obtained, pt states she has had no sexual activity for 5 months.       Relevant Orders   Cervicovaginal ancillary only   Lumbar pain - Primary    reports starting Mobic for pain, but caused an allergic reaction so she stopped. She reports taking Advil and Aleve in the past and tolerated. Sending Naproxen, advised on use & SE, and using good body mechanics at work. Continue using heat application 2-3x/day. f/u with PCP.      Relevant Medications   naproxen (NAPROSYN) 500 MG tablet    Meds ordered this encounter  Medications   naproxen  (NAPROSYN) 500 MG tablet    Sig: Take 1 tablet (500 mg total) by mouth 2 (two) times daily with a meal.    Dispense:  30 tablet    Refill:  0    Order Specific Question:   Supervising Provider    Answer:   ANDY, CAMILLE L [[id number]]

## 2021-02-05 NOTE — Patient Instructions (Signed)
It was very nice to see you today!  We are sending the vaginal swab for testing today, you see results in 2-3 days on MyChart and we will also notify you by phone. If it is positive for anything, I will send a medication to treat to your pharmacy.     PLEASE NOTE:  If you had any lab tests please let us know if you have not heard back within a few days. You may see your results on MyChart before we have a chance to review them but we will give you a call once they are reviewed by [place]. If we ordered any referrals today, please let us know if you have not heard from their office within the next week.

## 2021-02-06 ENCOUNTER — Ambulatory Visit: Payer: Medicaid Other | Admitting: Family

## 2021-02-06 LAB — CERVICOVAGINAL ANCILLARY ONLY
Bacterial Vaginitis (gardnerella): NEGATIVE
Candida Glabrata: NEGATIVE
Candida Vaginitis: NEGATIVE
Comment: NEGATIVE
Comment: NEGATIVE
Comment: NEGATIVE
Comment: NEGATIVE
Trichomonas: NEGATIVE

## 2021-02-08 NOTE — Progress Notes (Signed)
vaginal swab negative for any STD, yeast or BV.

## 2021-03-19 ENCOUNTER — Encounter: Payer: Self-pay | Admitting: Physician Assistant

## 2021-03-20 NOTE — Telephone Encounter (Signed)
Left voice message for patient to call clinic.  

## 2021-03-20 NOTE — Telephone Encounter (Signed)
Patient called back in regards to voice mail left. ?

## 2021-03-23 ENCOUNTER — Other Ambulatory Visit: Payer: Self-pay

## 2021-03-23 DIAGNOSIS — F339 Major depressive disorder, recurrent, unspecified: Secondary | ICD-10-CM

## 2021-03-23 MED ORDER — TRIAMCINOLONE ACETONIDE 0.1 % EX CREA: 1.0000 "application " | TOPICAL_CREAM | Freq: Two times a day (BID) | CUTANEOUS | 1 refills | Status: AC

## 2021-03-23 NOTE — Telephone Encounter (Signed)
Referral Placed to [facility],patient notified ?

## 2021-04-06 ENCOUNTER — Other Ambulatory Visit: Payer: Self-pay

## 2021-04-06 ENCOUNTER — Emergency Department (HOSPITAL_BASED_OUTPATIENT_CLINIC_OR_DEPARTMENT_OTHER)
Admission: EM | Admit: 2021-04-06 | Discharge: 2021-04-06 | Disposition: A | Discharge status: Home / Self Care | Payer: Commercial Managed Care - HMO | Attending: Emergency Medicine | Admitting: Emergency Medicine

## 2021-04-06 ENCOUNTER — Emergency Department (HOSPITAL_BASED_OUTPATIENT_CLINIC_OR_DEPARTMENT_OTHER): Payer: Commercial Managed Care - HMO | Admitting: Radiology

## 2021-04-06 ENCOUNTER — Encounter (HOSPITAL_BASED_OUTPATIENT_CLINIC_OR_DEPARTMENT_OTHER): Payer: Self-pay

## 2021-04-06 DIAGNOSIS — S39012A Strain of muscle, fascia and tendon of lower back, initial encounter: Secondary | ICD-10-CM | POA: Insufficient documentation

## 2021-04-06 DIAGNOSIS — Y92410 Unspecified street and highway as the place of occurrence of the external cause: Secondary | ICD-10-CM | POA: Diagnosis not present

## 2021-04-06 DIAGNOSIS — S3992XA Unspecified injury of lower back, initial encounter: Secondary | ICD-10-CM | POA: Diagnosis present

## 2021-04-06 MED ORDER — CYCLOBENZAPRINE HCL 10 MG PO TABS: 10.0000 mg | ORAL_TABLET | Freq: Two times a day (BID) | ORAL | 0 refills | Status: DC | PRN

## 2021-04-06 MED ORDER — NAPROXEN 500 MG PO TABS: 500.0000 mg | ORAL_TABLET | Freq: Two times a day (BID) | ORAL | 0 refills | Status: DC
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
## 2021-04-06 NOTE — Discharge Instructions (Addendum)
Take the medications as needed for aches and pains.  Your symptoms should improve over the next week ?
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
## 2021-10-12 ENCOUNTER — Encounter: Payer: Self-pay | Admitting: *Deleted

## 2021-12-31 ENCOUNTER — Encounter: Payer: Self-pay | Admitting: *Deleted

## 2022-01-15 ENCOUNTER — Ambulatory Visit: Payer: Commercial Managed Care - HMO | Admitting: Physician Assistant

## 2022-01-21 ENCOUNTER — Encounter: Payer: Self-pay | Admitting: Physician Assistant

## 2022-01-21 ENCOUNTER — Ambulatory Visit (INDEPENDENT_AMBULATORY_CARE_PROVIDER_SITE_OTHER): Payer: Commercial Managed Care - HMO | Admitting: Physician Assistant

## 2022-01-21 ENCOUNTER — Ambulatory Visit (INDEPENDENT_AMBULATORY_CARE_PROVIDER_SITE_OTHER): Payer: Commercial Managed Care - HMO

## 2022-01-21 VITALS — BP 104/68 | HR 74 | Temp 98.0°F | Ht 63.0 in | Wt 140.8 lb

## 2022-01-21 DIAGNOSIS — G479 Sleep disorder, unspecified: Secondary | ICD-10-CM | POA: Diagnosis not present

## 2022-01-21 DIAGNOSIS — N949 Unspecified condition associated with female genital organs and menstrual cycle: Secondary | ICD-10-CM | POA: Diagnosis not present

## 2022-01-21 DIAGNOSIS — R300 Dysuria: Secondary | ICD-10-CM

## 2022-01-21 DIAGNOSIS — Z23 Encounter for immunization: Secondary | ICD-10-CM

## 2022-01-21 LAB — POC URINALSYSI DIPSTICK (AUTOMATED)
Bilirubin, UA: NEGATIVE
Glucose, UA: NEGATIVE
Ketones, UA: NEGATIVE
Nitrite, UA: NEGATIVE
Protein, UA: POSITIVE — AB
Spec Grav, UA: 1.02 (ref 1.010–1.025)
Urobilinogen, UA: 0.2 E.U./dL
pH, UA: 5 (ref 5.0–8.0)

## 2022-01-21 MED ORDER — TRAZODONE HCL 50 MG PO TABS: 25.0000 mg | ORAL_TABLET | Freq: Every evening | ORAL | 0 refills | Status: AC | PRN

## 2022-01-21 NOTE — Progress Notes (Signed)
Subjective:    Patient ID: Jaime Frost, female    DOB: [birth year], 42 y.o.   MRN: [id number]  Chief Complaint  Patient presents with   burning    Pt states vag burning, and it started last Friday, pt states its not as bad as it was last week. Pt states cycle approaching and it becomes irritated. Pt denied being sexual active prior to burning. Pt states had + covid test last thursday    HPI Patient is in today for vaginal symptoms. She is having some brown / itchy discharge and irritation x a week or more. Some dysuria. She has been bathing in a soap to help her sleep at night, not sure if this is worsening symptoms or not.  Wondering if too many sodas has exacerbated symptoms.  LMP 12/19/2021.  She has not been sexually active in the last year or more.   No fever or chills. No back pain or other concerns. She had COVID last week, but recovered since then.  Also concerns about ongoing issues with falling / staying asleep.  Past Medical History:  Diagnosis Date   Chlamydia    UTI (urinary tract infection)    Vaginal delivery 01/2017    Past Surgical History:  Procedure Laterality Date   fractured ankle Left 03/2001   NO PAST SURGERIES      Family History  Problem Relation Age of Onset   Diabetes Mother    Hypertension Mother    Heart disease Maternal Grandmother    Heart disease Maternal Grandfather     Social History   Tobacco Use   Smoking status: Never   Smokeless tobacco: Never  Vaping Use   Vaping Use: Never used  Substance Use Topics   Alcohol use: Not Currently   Drug use: Not Currently    Types: Marijuana     Allergies  Allergen Reactions   Meloxicam Hives    Review of Systems NEGATIVE UNLESS OTHERWISE INDICATED IN HPI      Objective:     BP 104/68 (BP Location: Left Arm, Patient Position: Sitting)   Pulse 74   Temp 98 F (36.7 C) (Temporal)   Ht 5\' 3"  (1.6 m)   Wt 140 lb 12.8 oz (63.9 kg)   LMP 12/19/2021 (Approximate)   SpO2 100%    BMI 24.94 kg/m   Wt Readings from Last 3 Encounters:  01/21/22 140 lb 12.8 oz (63.9 kg)  04/06/21 139 lb 15.9 oz (63.5 kg)  02/05/21 140 lb (63.5 kg)    BP Readings from Last 3 Encounters:  01/21/22 104/68  04/06/21 113/90  02/05/21 (!) 89/51     Physical Exam Vitals and nursing note reviewed. Exam conducted with a chaperone present.  Constitutional:      Appearance: Normal appearance.  Cardiovascular:     Rate and Rhythm: Normal rate and regular rhythm.     Pulses: Normal pulses.     Heart sounds: No murmur heard. Pulmonary:     Effort: Pulmonary effort is normal.     Breath sounds: Normal breath sounds.  Abdominal:     General: Abdomen is flat. Bowel sounds are normal.     Palpations: Abdomen is soft.     Tenderness: There is no abdominal tenderness.     Hernia: There is no hernia in the left inguinal area or right inguinal area.  Genitourinary:    General: Normal vulva.     Pubic Area: No rash.      Labia:  Right: No rash.        Left: No rash.      Vagina: Bleeding present. No vaginal discharge, tenderness or lesions.     Cervix: Normal.     Uterus: Normal.   Neurological:     Mental Status: She is alert.        Assessment & Plan:  Dysuria -     POCT Urinalysis Dipstick (Automated) -     Urine Culture  Vaginal burning  Difficulty sleeping  Other orders -     traZODone HCl; Take 0.5-1 tablets (25-50 mg total) by mouth at bedtime as needed for sleep.  Dispense: 30 tablet; Refill: 0    Reassured patient that overall GU exam is normal with exception of vaginal bleeding (menstrual cycle). Will send urine for culture, treat pending abnormal results. Encouraged patient to discontinue baths / new fragrance soaps, likely exacerbating symptoms.   Will start on Trazodone as directed to help with sleep. Pt to reach out and let me know how she's doing in a few weeks.    Return if symptoms worsen or fail to improve.  This note was prepared with  assistance of Systems analyst. Occasional wrong-word or sound-a-like substitutions may have occurred due to the inherent limitations of voice recognition software.      Beatris Belen M Mihran Lebarron, PA-C

## 2022-01-22 LAB — URINE CULTURE
MICRO NUMBER:: 14388471
SPECIMEN QUALITY:: ADEQUATE

## 2022-02-01 ENCOUNTER — Encounter: Payer: Self-pay | Admitting: Family Medicine

## 2022-02-01 ENCOUNTER — Ambulatory Visit: Payer: Commercial Managed Care - HMO | Admitting: Physician Assistant

## 2022-02-01 ENCOUNTER — Ambulatory Visit (INDEPENDENT_AMBULATORY_CARE_PROVIDER_SITE_OTHER): Payer: Commercial Managed Care - HMO | Admitting: Family Medicine

## 2022-02-01 ENCOUNTER — Other Ambulatory Visit (HOSPITAL_COMMUNITY)
Admission: RE | Admit: 2022-02-01 | Discharge: 2022-02-01 | Disposition: A | Discharge status: Home / Self Care | Payer: Commercial Managed Care - HMO | Source: Ambulatory Visit | Attending: Family Medicine | Admitting: Family Medicine

## 2022-02-01 VITALS — BP 96/62 | HR 88 | Temp 97.8°F | Ht 63.0 in | Wt 141.0 lb

## 2022-02-01 DIAGNOSIS — Z114 Encounter for screening for human immunodeficiency virus [HIV]: Secondary | ICD-10-CM

## 2022-02-01 DIAGNOSIS — R300 Dysuria: Secondary | ICD-10-CM

## 2022-02-01 DIAGNOSIS — Z113 Encounter for screening for infections with a predominantly sexual mode of transmission: Secondary | ICD-10-CM

## 2022-02-01 DIAGNOSIS — D649 Anemia, unspecified: Secondary | ICD-10-CM | POA: Diagnosis not present

## 2022-02-01 DIAGNOSIS — N898 Other specified noninflammatory disorders of vagina: Secondary | ICD-10-CM | POA: Insufficient documentation

## 2022-02-01 LAB — URINALYSIS, ROUTINE W REFLEX MICROSCOPIC
Bilirubin Urine: NEGATIVE
Hgb urine dipstick: NEGATIVE
Ketones, ur: NEGATIVE
Leukocytes,Ua: NEGATIVE
Nitrite: NEGATIVE
Specific Gravity, Urine: 1.015 (ref 1.000–1.030)
Total Protein, Urine: NEGATIVE
Urine Glucose: NEGATIVE
Urobilinogen, UA: 0.2 (ref 0.0–1.0)
pH: 7 (ref 5.0–8.0)

## 2022-02-01 LAB — COMPREHENSIVE METABOLIC PANEL
ALT: 11 U/L (ref 0–35)
AST: 13 U/L (ref 0–37)
Albumin: 4.3 g/dL (ref 3.5–5.2)
Alkaline Phosphatase: 73 U/L (ref 39–117)
BUN: 11 mg/dL (ref 6–23)
CO2: 30 mEq/L (ref 19–32)
Calcium: 9.9 mg/dL (ref 8.4–10.5)
Chloride: 101 mEq/L (ref 96–112)
Creatinine, Ser: 0.82 mg/dL (ref 0.40–1.20)
GFR: 88.77 mL/min (ref >60.00)
Glucose, Bld: 84 mg/dL (ref 70–99)
Potassium: 4.1 mEq/L (ref 3.5–5.1)
Sodium: 139 mEq/L (ref 135–145)
Total Bilirubin: 0.8 mg/dL (ref 0.2–1.2)
Total Protein: 7.2 g/dL (ref 6.0–8.3)

## 2022-02-01 LAB — CBC WITH DIFFERENTIAL/PLATELET
Basophils Absolute: 0 10*3/uL (ref 0.0–0.1)
Basophils Relative: 0.3 % (ref 0.0–3.0)
Eosinophils Absolute: 0.1 10*3/uL (ref 0.0–0.7)
Eosinophils Relative: 1 % (ref 0.0–5.0)
HCT: 38.2 % (ref 36.0–46.0)
Hemoglobin: 12.8 g/dL (ref 12.0–15.0)
Lymphocytes Relative: 43.5 % (ref 12.0–46.0)
Lymphs Abs: 2.8 10*3/uL (ref 0.7–4.0)
MCHC: 33.4 g/dL (ref 30.0–36.0)
MCV: 96.9 fl (ref 78.0–100.0)
Monocytes Absolute: 0.4 10*3/uL (ref 0.1–1.0)
Monocytes Relative: 6 % (ref 3.0–12.0)
Neutro Abs: 3.2 10*3/uL (ref 1.4–7.7)
Neutrophils Relative %: 49.2 % (ref 43.0–77.0)
Platelets: 346 10*3/uL (ref 150.0–400.0)
RBC: 3.94 Mil/uL (ref 3.87–5.11)
RDW: 13.5 % (ref 11.5–15.5)
WBC: 6.5 10*3/uL (ref 4.0–10.5)

## 2022-02-01 NOTE — Progress Notes (Signed)
Phone [phone] In person visit   Subjective:   Jaime Frost is a 42 y.o. year old very pleasant female patient who presents for/with See problem oriented charting Chief Complaint  Patient presents with   Vaginal Discharge    Pt was here on 01/21/22 with Jaime Frost for vaginal itching and burning, pt here today saying she has brown discharge and still having vaginal burning/irrigating. Pt states it feels as if on the inside, states when she wipes its brown discharge on tissue. Pt states she hasn't been sexual activity since 2021.     Past Medical History-  Patient Active Problem List   Diagnosis Date Noted   Lumbar pain 02/05/2021   Vaginal delivery 02/04/2017   Indication for care in labor or delivery 02/03/2017   CONSTIPATION 05/26/2009   CANDIDIASIS OF VULVA AND VAGINA 01/03/2008   BACTERIAL VAGINITIS 11/16/2007   Vaginal discharge 11/16/2007   PAP SMEAR, LGSIL, ABNORMAL 09/27/2007   HPV 08/18/2007   MICROSCOPIC HEMATURIA 08/18/2007    Medications- reviewed and updated Current Outpatient Medications  Medication Sig Dispense Refill   traZODone (DESYREL) 50 MG tablet Take 0.5-1 tablets (25-50 mg total) by mouth at bedtime as needed for sleep. 30 tablet 0   No current facility-administered medications for this visit.     Objective:  BP 96/62 (BP Location: Right Arm, Patient Position: Sitting)   Pulse 88   Temp 97.8 F (36.6 C) (Temporal)   Ht 5\' 3"  (1.6 m)   Wt 141 lb (64 kg)   LMP 12/19/2021 (Approximate)   SpO2 98%   BMI 24.98 kg/m  Gen: NAD, resting comfortably CV: RRR no murmurs rubs or gallops Lungs: CTAB no crackles, wheeze, rhonchi Abdomen: soft/nontender other than mild in suprapubic area/nondistended/normal bowel sounds. No rebound or guarding.  Ext: no edema Skin: warm, dry Pelvic: cervix normal in appearance, external genitalia normal, no adnexal masses or tenderness, no cervical motion tenderness, uterus normal size, shape, and consistency, and vagina  with some whitish yellow discharge - no obvious odor     Assessment and Plan   # vaginal discharge/dysuria S: Patient was seen by Jaime Duty, PA on January 21, 2022 complaining of brown/itchy discharge and irritation for over a week along with some mild dysuria.  She wondered if bathing in a soap to help her sleep at night or drinking too many sodas worsen symptoms. Had noted irritation prior to cycle.   Had not been sexually active since 2021- testing 02/05/21 showed no gonorrhea/chlamydia/trichomonas or yeast or BV.   Genitourinary exam that day was normal with exception of some vaginal bleeding but she was on her menstrual cycle.  She had urinalysis and urine culture and was advised to discontinue-with a new fragrance/soaps.  Also given trazodone for sleep.  Urine culture showed no infection.  Initial urinalysis with blood (1 cm effectively on her cycle) and trace leukocytes.  Today she states ongoing intermittent brown discharge but constant burning in her vaginal area as well as irritation. Still with polyuria (drinking a lot of fluids)and dysuria or uncomfortable when sitting down.  When she wiped she noticed brown discharge on the tissue. Has not taken AZO yet. No abdominal pain other than suprapubic pain with palpation. Some low back pain- wearing brace to see if helps- wearing for work lifting toddlers -no fever, abnormal fatigue, nause, vomiting A/P: 68 42 year old female presenting with ongoing dysuria, brown vaginal discharge that previously tested negative for UTI and with previous reassuring vaginal exam though was on her menstrual cycle  at that time-today with some discharge noticed but otherwise normal GU exam. - We were going to add minimum check for BV and yeast-though not recently sexually active she did agree to testing for gonorrhea/chlamydia/trichomonas to be on the safe side - She also requested baseline blood work with CBC (mild anemia in the past likely restriction related)  and CMP and wanted STD testing with RPR and HIV just to be thorough though risk based on history remains very low -We discussed "Even if this all comes back with no clear cause- since you are continuing to have symptoms- please let us know so we can refer you to gynecology as next step"  Recommended follow up: Schedule physical with [name] so you can update pap smear unless we refer her to GYN  Lab/Order associations:   ICD-10-CM   1. Vaginal discharge  N89.8 Cervicovaginal ancillary only    Comprehensive metabolic panel    2. Dysuria  R30.0 Urine Culture    Urinalysis, Routine w reflex microscopic    Comprehensive metabolic panel    3. Mild anemia  D64.9 CBC with Differential/Platelet    4. Screening for HIV (human immunodeficiency virus)  Z11.4 HIV Antibody (routine testing w rflx)    5. Screening examination for venereal disease  Z11.3 RPR     Return precautions advised.  Garret Reddish, MD

## 2022-02-01 NOTE — Patient Instructions (Addendum)
Please stop by lab before you go If you have mychart- we will send your results within 3 business days of [place] receiving them.  If you do not have mychart- we will call you about results within 5 business days of [place] receiving them.  *please also note that you will see labs on mychart as soon as they post. I will later go in and write notes on them- will say "notes from Dr. Yong Channel"   Hoping to have results back by Thursday at latest- wait o treating until we see results  Even if this all comes back with no clear cause- since you are continuing to have symptoms- please let us know so we can refer you to gynecology as next step  Recommended follow up: Schedule physical with [name] so you can update pap smear

## 2022-02-02 LAB — CERVICOVAGINAL ANCILLARY ONLY
Bacterial Vaginitis (gardnerella): NEGATIVE
Candida Glabrata: NEGATIVE
Candida Vaginitis: NEGATIVE
Chlamydia: NEGATIVE
Comment: NEGATIVE
Comment: NEGATIVE
Comment: NEGATIVE
Comment: NEGATIVE
Comment: NEGATIVE
Comment: NORMAL
Neisseria Gonorrhea: NEGATIVE
Trichomonas: NEGATIVE

## 2022-02-02 LAB — RPR: RPR Ser Ql: NONREACTIVE

## 2022-02-02 LAB — URINE CULTURE
MICRO NUMBER:: 14431623
SPECIMEN QUALITY:: ADEQUATE

## 2022-02-02 LAB — HIV ANTIBODY (ROUTINE TESTING W REFLEX): HIV 1&2 Ab, 4th Generation: NONREACTIVE

## 2022-02-04 ENCOUNTER — Other Ambulatory Visit: Payer: Self-pay

## 2022-02-04 DIAGNOSIS — N898 Other specified noninflammatory disorders of vagina: Secondary | ICD-10-CM

## 2022-03-28 ENCOUNTER — Encounter: Payer: Self-pay | Admitting: Physician Assistant

## 2022-03-29 NOTE — Telephone Encounter (Signed)
Please see pt msg and advise if ok to send in cream for patient

## 2022-03-31 ENCOUNTER — Other Ambulatory Visit: Payer: Self-pay | Admitting: Physician Assistant

## 2022-03-31 MED ORDER — CLOTRIMAZOLE 1 % EX CREA: 1.0000 | TOPICAL_CREAM | Freq: Two times a day (BID) | CUTANEOUS | 2 refills | Status: AC

## 2022-06-21 ENCOUNTER — Emergency Department (HOSPITAL_BASED_OUTPATIENT_CLINIC_OR_DEPARTMENT_OTHER)
Admission: EM | Admit: 2022-06-21 | Discharge: 2022-06-21 | Disposition: A | Discharge status: Home / Self Care | Payer: Commercial Managed Care - HMO | Attending: Emergency Medicine | Admitting: Emergency Medicine

## 2022-06-21 ENCOUNTER — Encounter (HOSPITAL_BASED_OUTPATIENT_CLINIC_OR_DEPARTMENT_OTHER): Payer: Self-pay | Admitting: Emergency Medicine

## 2022-06-21 ENCOUNTER — Other Ambulatory Visit: Payer: Self-pay

## 2022-06-21 DIAGNOSIS — R21 Rash and other nonspecific skin eruption: Secondary | ICD-10-CM | POA: Diagnosis present

## 2022-06-21 DIAGNOSIS — T7840XA Allergy, unspecified, initial encounter: Secondary | ICD-10-CM

## 2022-06-21 MED ORDER — PREDNISONE 10 MG PO TABS: 40.0000 mg | ORAL_TABLET | Freq: Every day | ORAL | 0 refills | Status: AC

## 2022-06-21 MED ORDER — FAMOTIDINE 20 MG PO TABS
20.0000 mg | ORAL_TABLET | Freq: Once | ORAL | Status: AC
  Administered 2022-06-21: 20 mg via ORAL
  Filled 2022-06-21: qty 1

## 2022-06-21 MED ORDER — DEXAMETHASONE SODIUM PHOSPHATE 10 MG/ML IJ SOLN
10.0000 mg | Freq: Once | INTRAMUSCULAR | Status: AC
  Administered 2022-06-21: 10 mg via INTRAMUSCULAR
  Filled 2022-06-21: qty 1

## 2022-06-21 MED ORDER — EPINEPHRINE 0.3 MG/0.3ML IJ SOAJ: 0.3000 mg | INTRAMUSCULAR | 0 refills | Status: AC | PRN

## 2022-06-21 MED ORDER — DIPHENHYDRAMINE HCL 25 MG PO CAPS
50.0000 mg | ORAL_CAPSULE | Freq: Once | ORAL | Status: AC
  Administered 2022-06-21: 50 mg via ORAL
  Filled 2022-06-21: qty 2

## 2022-06-21 NOTE — ED Provider Notes (Signed)
[place] EMERGENCY DEPARTMENT AT [facility] Provider Note   CSN: [id number] Arrival date & time: 06/21/22  1715     History  Chief Complaint  Patient presents with   Rash    Jaime Frost is a 42 y.o. female with no PMH who presents to ED c/o rash for one week. Notes exposure to hand foot and mouth disease at work. No fever, cough, congestion, or other symptoms. No new medications, products, or exposures. Has been taking Benadryl at home for itching without relief and rash feels like it is spreading. No known sick contacts. No history of this.       Home Medications Prior to Admission medications   Medication Sig Start Date End Date Taking? Authorizing Provider  EPINEPHrine 0.3 mg/0.3 mL IJ SOAJ injection Inject 0.3 mg into the muscle as needed for anaphylaxis. 06/21/22  Yes Marvie Brevik L, PA-C  predniSONE (DELTASONE) 10 MG tablet Take 4 tablets (40 mg total) by mouth daily for 5 days. 06/21/22 06/26/22 Yes Brydan Downard L, PA-C  clotrimazole (LOTRIMIN) 1 % cream Apply 1 Application topically 2 (two) times daily. 03/31/22   Allwardt, Crist Infante, PA-C  traZODone (DESYREL) 50 MG tablet Take 0.5-1 tablets (25-50 mg total) by mouth at bedtime as needed for sleep. 01/21/22   Allwardt, Crist Infante, PA-C      Allergies    Meloxicam    Review of Systems   Review of Systems  All other systems reviewed and are negative.   Physical Exam Updated Vital Signs BP 116/80 (BP Location: Right Arm)   Pulse (!) 117   Temp 98.6 F (37 C) (Oral)   Resp 16   Ht 5' (1.524 m)   Wt 66.2 kg   SpO2 96%   BMI 28.51 kg/m  Physical Exam Vitals and nursing note reviewed.  Constitutional:      General: She is not in acute distress.    Appearance: Normal appearance. She is not ill-appearing or toxic-appearing.  HENT:     Head: Normocephalic and atraumatic.     Mouth/Throat:     Mouth: Mucous membranes are moist.     Pharynx: Oropharynx is clear. No oropharyngeal exudate or posterior  oropharyngeal erythema.     Comments: Widely patent airway, no intra-oral lesions Eyes:     Conjunctiva/sclera: Conjunctivae normal.  Cardiovascular:     Rate and Rhythm: Normal rate and regular rhythm.     Heart sounds: No murmur heard. Pulmonary:     Effort: Pulmonary effort is normal. No respiratory distress.     Breath sounds: Normal breath sounds. No stridor.  Abdominal:     General: Abdomen is flat.     Palpations: Abdomen is soft.  Musculoskeletal:        General: Normal range of motion.     Cervical back: Normal range of motion and neck supple.     Right lower leg: No edema.     Left lower leg: No edema.  Skin:    General: Skin is warm and dry.     Capillary Refill: Capillary refill takes less than 2 seconds.     Comments: Scattered papular slightly erythematous rash to trunk, arms, face, and neck, most notable to upper chest wall and back with areas or urticarial appearing rash as well, no open lesions, no drainage, no signs of cellulitic changes, appears most consistent with allergic reaction  Neurological:     Mental Status: She is alert. Mental status is at baseline.  Psychiatric:  Behavior: Behavior normal.     ED Results / Procedures / Treatments   Labs (all labs ordered are listed, but only abnormal results are displayed) Labs Reviewed - No data to display  EKG None  Radiology No results found.  Procedures Procedures    Medications Ordered in ED Medications  dexamethasone (DECADRON) injection 10 mg (10 mg Intramuscular Given 06/21/22 1849)  famotidine (PEPCID) tablet 20 mg (20 mg Oral Given 06/21/22 1848)  diphenhydrAMINE (BENADRYL) capsule 50 mg (50 mg Oral Given 06/21/22 1848)    ED Course/ Medical Decision Making/ A&P                             Medical Decision Making Risk Prescription drug management.   Medical Decision Making:   Jaime Frost is a 42 y.o. female who presented to the ED today with rash detailed above.     Complete  initial physical exam performed, notably the patient was in no respiratory distress. Scattered papular slightly erythematous rash to trunk, arms, face, and neck, most notable to upper chest wall and back with areas or urticarial appearing rash as well, no open lesions, no drainage, no signs of cellulitic changes, appears most consistent with allergic reaction.    Reviewed and confirmed nursing documentation for past medical history, family history, social history.    Initial Assessment:   With the patient's presentation, differential diagnosis includes but is not limited to contact dermatitis, allergic reaction, anaphylaxis, medication reaction, hand foot and mouth disease, viral syndrome.  This is most consistent with an acute complicated illness  Initial Plan:  Symptomatic management Objective evaluation as below reviewed   Final Assessment and Plan:   42 year old female presents to ED c/o rash. Somewhat diffuse as above, appears allergic, described as pruritic. Medications as above given for treatment with good relief. Will treat with steroids at home and have closely follow up with PCP. Pt expressed agreement with plan. Epi-pen given for any anaphylactic type future reactions. Strict ED return precautions given, all questions answered, and stable for discharge.     Clinical Impression:  1. Allergic reaction, initial encounter      Discharge           Final Clinical Impression(s) / ED Diagnoses Final diagnoses:  Allergic reaction, initial encounter    Rx / DC Orders ED Discharge Orders          Ordered    predniSONE (DELTASONE) 10 MG tablet  Daily        06/21/22 1909    EPINEPHrine 0.3 mg/0.3 mL IJ SOAJ injection  As needed        06/21/22 1909              Richardson Dopp 06/21/22 2310    Ernie Avena, MD 06/21/22 [phone]
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
## 2022-06-21 NOTE — Discharge Instructions (Signed)
Thank you for letting us take care of you today.  Your rash appears consistent with an allergic reaction. It is hard to say what caused it. We gave you multiple medications in the ED to help with this. I am sending you home with steroids to help with your symptoms over the next few days. Please follow up closely with your PCP for recheck later this week. If you continue to have symptoms, they may want to do additional testing or refer you to an allergist.  I also recommend taking over the counter Benadryl to help with itching/symptoms. I am prescribing an epi-pen for any future severe allergic reactions where you notice difficulty breathing, swallowing, or throat swelling.   For any new or worsening condition, please return to nearest ED for re-evaluation.

## 2022-07-02 ENCOUNTER — Encounter: Payer: Self-pay | Admitting: Physician Assistant

## 2022-07-02 ENCOUNTER — Ambulatory Visit (INDEPENDENT_AMBULATORY_CARE_PROVIDER_SITE_OTHER): Payer: Commercial Managed Care - HMO | Admitting: Physician Assistant

## 2022-07-02 ENCOUNTER — Other Ambulatory Visit (HOSPITAL_COMMUNITY)
Admission: RE | Admit: 2022-07-02 | Discharge: 2022-07-02 | Disposition: A | Discharge status: Home / Self Care | Payer: Commercial Managed Care - HMO | Source: Ambulatory Visit | Attending: Physician Assistant | Admitting: Physician Assistant

## 2022-07-02 VITALS — BP 90/62 | HR 98 | Temp 98.2°F | Ht 60.0 in | Wt 143.8 lb

## 2022-07-02 DIAGNOSIS — N898 Other specified noninflammatory disorders of vagina: Secondary | ICD-10-CM

## 2022-07-02 DIAGNOSIS — R300 Dysuria: Secondary | ICD-10-CM

## 2022-07-02 DIAGNOSIS — R350 Frequency of micturition: Secondary | ICD-10-CM

## 2022-07-02 DIAGNOSIS — Z124 Encounter for screening for malignant neoplasm of cervix: Secondary | ICD-10-CM | POA: Insufficient documentation

## 2022-07-02 LAB — POCT URINALYSIS DIPSTICK
Bilirubin, UA: NEGATIVE
Glucose, UA: NEGATIVE
Ketones, UA: NEGATIVE
Leukocytes, UA: NEGATIVE
Nitrite, UA: NEGATIVE
Protein, UA: POSITIVE — AB
Spec Grav, UA: >=1.03 — AB (ref 1.010–1.025)
Urobilinogen, UA: 0.2 E.U./dL
pH, UA: 6 (ref 5.0–8.0)

## 2022-07-02 MED ORDER — FLUCONAZOLE 150 MG PO TABS: 150.0000 mg | ORAL_TABLET | Freq: Every day | ORAL | 0 refills | Status: AC

## 2022-07-02 NOTE — Progress Notes (Signed)
Subjective:    Patient ID: Margot Ables, female    DOB: 07-31-1980, 42 y.o.   MRN: [id number]  Chief Complaint  Patient presents with   Possible UTI    All symptoms for about two days. Has been intermittent.   Vaginal Discharge   Vaginal burning/irritation   Abdominal Cramping    Constant.   Urinary Frequency   Strong vaginal odor   Dysuria    HPI Patient is in today for urinary / vaginal discharge symptoms x 2 days.  Dysuria, some abdominal cramping.  LMP 06/25/22 - 06/30/22.  Some yellow/ brown vaginal discharge and odor. Urinary frequency, getting up at night to use the restroom, and also during day.  No new sexual partners per pt.   She has also been taking prednisone tablets due to recent allergic reaction 06/21/22 (ED visit) - doing better in regard to those symptoms.   Pap not up to date per pt. Long history of STDs in her past. Did not have HPV vaccines.   Past Medical History:  Diagnosis Date   Chlamydia    UTI (urinary tract infection)    Vaginal delivery 01/2017    Past Surgical History:  Procedure Laterality Date   fractured ankle Left 03/2001   NO PAST SURGERIES      Family History  Problem Relation Age of Onset   Diabetes Mother    Hypertension Mother    Heart disease Maternal Grandmother    Heart disease Maternal Grandfather     Social History   Tobacco Use   Smoking status: Never   Smokeless tobacco: Never  Vaping Use   Vaping Use: Never used  Substance Use Topics   Alcohol use: Not Currently   Drug use: Not Currently    Types: Marijuana     Allergies  Allergen Reactions   Meloxicam Hives    Review of Systems NEGATIVE UNLESS OTHERWISE INDICATED IN HPI      Objective:     BP 90/62 (BP Location: Left Arm, Patient Position: Sitting)   Pulse 98   Temp 98.2 F (36.8 C) (Oral)   Ht 5' (1.524 m)   Wt 143 lb 12.8 oz (65.2 kg)   LMP 06/26/2022   SpO2 98%   BMI 28.08 kg/m   Wt Readings from Last 3 Encounters:  07/02/22 143 lb  12.8 oz (65.2 kg)  06/21/22 146 lb (66.2 kg)  02/01/22 141 lb (64 kg)    BP Readings from Last 3 Encounters:  07/02/22 90/62  06/21/22 116/80  02/01/22 96/62     Physical Exam Vitals and nursing note reviewed. Exam conducted with a chaperone present.  Constitutional:      Appearance: Normal appearance. She is normal weight. She is not toxic-appearing.  HENT:     Head: Normocephalic and atraumatic.     Right Ear: Tympanic membrane, ear canal and external ear normal.     Left Ear: Tympanic membrane, ear canal and external ear normal.     Nose: Nose normal.     Mouth/Throat:     Mouth: Mucous membranes are moist.  Eyes:     Extraocular Movements: Extraocular movements intact.     Conjunctiva/sclera: Conjunctivae normal.     Pupils: Pupils are equal, round, and reactive to light.  Neck:     Thyroid: No thyroid mass, thyromegaly or thyroid tenderness.  Cardiovascular:     Rate and Rhythm: Normal rate and regular rhythm.     Pulses: Normal pulses.     Heart  sounds: Normal heart sounds.  Pulmonary:     Effort: Pulmonary effort is normal.     Breath sounds: Normal breath sounds.  Abdominal:     General: Abdomen is flat. Bowel sounds are normal.     Palpations: Abdomen is soft.     Tenderness: There is abdominal tenderness in the suprapubic area. There is no right CVA tenderness or left CVA tenderness.  Genitourinary:    General: Normal vulva.     Labia:        Right: No rash, tenderness or lesion.        Left: No rash, tenderness or lesion.      Vagina: Normal.     Cervix: Normal.     Uterus: Normal.      Adnexa: Right adnexa normal and left adnexa normal.  Musculoskeletal:        General: Normal range of motion.     Cervical back: Normal range of motion and neck supple.     Right lower leg: No edema.     Left lower leg: No edema.  Lymphadenopathy:     Cervical: No cervical adenopathy.  Skin:    General: Skin is warm and dry.     Findings: No lesion.  Neurological:      General: No focal deficit present.     Mental Status: She is alert and oriented to person, place, and time.  Psychiatric:        Mood and Affect: Mood normal.        Behavior: Behavior normal.        Thought Content: Thought content normal.        Judgment: Judgment normal.        Assessment & Plan:  Urinary frequency -     POCT urinalysis dipstick -     Urine Culture  Dysuria -     POCT urinalysis dipstick -     Urine Culture  Vaginal discharge -     Cervicovaginal ancillary only  Pap smear for cervical cancer screening -     Cytology - PAP  Other orders -     Fluconazole; Take 1 tablet (150 mg total) by mouth daily.  Dispense: 1 tablet; Refill: 0   Unclear etiology of symptoms at this time. No red flags on exam. No chandelier sign.  She may have yeast infection secondary to recent menstrual cycle and steroid use. Sent Diflucan 150 mg to take this week as one dose.  Updated her pap smear while performing pelvic exam today. Will send off cervicovaginal swab and treat if any abnormal results. Also sent urine for culture. Encouraged her to keep well hydrated.   ER or urgent care this weekend if sudden worse or severe symptoms.  Time Spent: 31 minutes of total time was spent on the date of the encounter performing the following actions: chart review prior to seeing the patient, obtaining history, performing a medically necessary exam, counseling on the treatment plan, placing orders, and documenting in our EHR.    Return if symptoms worsen or fail to improve.    Oshea Percival M Anglea Gordner, PA-C

## 2022-07-03 LAB — URINE CULTURE
MICRO NUMBER:: 15084581
SPECIMEN QUALITY:: ADEQUATE

## 2022-07-05 LAB — CYTOLOGY - PAP
Comment: NEGATIVE
Diagnosis: NEGATIVE
High risk HPV: NEGATIVE

## 2022-07-05 LAB — CERVICOVAGINAL ANCILLARY ONLY
Bacterial Vaginitis (gardnerella): NEGATIVE
Candida Glabrata: NEGATIVE
Candida Vaginitis: NEGATIVE
Chlamydia: NEGATIVE
Comment: NEGATIVE
Comment: NEGATIVE
Comment: NEGATIVE
Comment: NEGATIVE
Comment: NEGATIVE
Comment: NORMAL
Neisseria Gonorrhea: NEGATIVE
Trichomonas: NEGATIVE

## 2022-07-27 ENCOUNTER — Other Ambulatory Visit: Payer: Self-pay | Admitting: Physician Assistant

## 2022-07-27 NOTE — Telephone Encounter (Signed)
Please advise 

## 2023-07-13 ENCOUNTER — Encounter (HOSPITAL_BASED_OUTPATIENT_CLINIC_OR_DEPARTMENT_OTHER): Payer: Self-pay

## 2023-07-13 ENCOUNTER — Emergency Department (HOSPITAL_BASED_OUTPATIENT_CLINIC_OR_DEPARTMENT_OTHER)
Admission: EM | Admit: 2023-07-13 | Discharge: 2023-07-13 | Disposition: A | Discharge status: Home / Self Care | Attending: Emergency Medicine | Admitting: Emergency Medicine

## 2023-07-13 ENCOUNTER — Other Ambulatory Visit: Payer: Self-pay

## 2023-07-13 DIAGNOSIS — W448XXA Other foreign body entering into or through a natural orifice, initial encounter: Secondary | ICD-10-CM | POA: Insufficient documentation

## 2023-07-13 DIAGNOSIS — T162XXA Foreign body in left ear, initial encounter: Secondary | ICD-10-CM | POA: Insufficient documentation

## 2023-07-13 DIAGNOSIS — H6692 Otitis media, unspecified, left ear: Secondary | ICD-10-CM | POA: Diagnosis not present

## 2023-07-13 MED ORDER — AMOXICILLIN 500 MG PO CAPS
500.0000 mg | ORAL_CAPSULE | Freq: Once | ORAL | Status: AC
  Administered 2023-07-13: 500 mg via ORAL
  Filled 2023-07-13: qty 1

## 2023-07-13 MED ORDER — AMOXICILLIN 500 MG PO CAPS: 500.0000 mg | ORAL_CAPSULE | Freq: Three times a day (TID) | ORAL | 0 refills | Status: AC

## 2023-07-13 NOTE — ED Triage Notes (Signed)
 Patient states qtip stuck in left ear

## 2023-07-13 NOTE — Discharge Instructions (Addendum)
 Evaluation today revealed that you did have a Q-tip in the left ear.  It was successfully removed.  After removal, the middle ear does appear to be infected.  I am starting on amoxicillin.  Please follow-up with your PCP.

## 2023-07-13 NOTE — ED Provider Notes (Signed)
 [place] EMERGENCY DEPARTMENT AT [facility] Provider Note   CSN: [id number] Arrival date & time: 07/13/23  1810     Patient presents with: Foreign Body in Ear  HPI Jaime Frost is a 43 y.o. female presenting for foreign body in the ear.  She states she was using a Q-tip in her left ear yesterday and the tip broke off into her ear.  Since then she has had irritation and muffled hearing in that ear.  She states that days prior the left ear was itching which is why she used a Q-tip in the first place per patient.      Foreign Body in Ear       Prior to Admission medications   Medication Sig Start Date End Date Taking? Authorizing Provider  amoxicillin (AMOXIL) 500 MG capsule Take 1 capsule (500 mg total) by mouth 3 (three) times daily. 07/13/23  Yes Jaylin Roundy K, PA-C  clotrimazole  (LOTRIMIN ) 1 % cream Apply 1 Application topically 2 (two) times daily. 03/31/22   Allwardt, Mardy HERO, PA-C  EPINEPHrine  0.3 mg/0.3 mL IJ SOAJ injection Inject 0.3 mg into the muscle as needed for anaphylaxis. 06/21/22   Gowens, Mariah L, PA-C  fluconazole  (DIFLUCAN ) 150 MG tablet Take 1 tablet (150 mg total) by mouth daily. 07/02/22   Allwardt, [name] M, PA-C  traZODone  (DESYREL ) 50 MG tablet Take 0.5-1 tablets (25-50 mg total) by mouth at bedtime as needed for sleep. 01/21/22   Allwardt, [name] M, PA-C    Allergies: Meloxicam     Review of Systems See HPI  Updated Vital Signs BP (!) 131/104 (BP Location: Right Arm)   Pulse (!) 113   Temp 98.6 F (37 C)   Resp 16   SpO2 95%   Physical Exam Constitutional:      Appearance: Normal appearance.  HENT:     Head: Normocephalic.     Left Ear: A middle ear effusion is present. A foreign body is present. No mastoid tenderness.     Ears:     Comments: On initial exam, white-colored foreign body noted in the ear canal.  Could not visualize TM.  After removal of the foreign body, reinspected and the TM did appear injected, erythematous,  bulging with likely effusion    Nose: Nose normal.   Eyes:     Conjunctiva/sclera: Conjunctivae normal.   Pulmonary:     Effort: Pulmonary effort is normal.   Neurological:     Mental Status: She is alert.   Psychiatric:        Mood and Affect: Mood normal.     (all labs ordered are listed, but only abnormal results are displayed) Labs Reviewed - No data to display  EKG: None  Radiology: No results found.   .Foreign Body Removal  Date/Time: 07/13/2023 8:19 PM  Performed by: Lang Norleen POUR, PA-C Authorized by: Lang Norleen POUR, PA-C  Consent: Verbal consent obtained Risks and benefits: risks, benefits and alternatives were discussed Consent given by: patient Patient understanding: patient states understanding of the procedure being performed Patient consent: the patient's understanding of the procedure matches consent given Patient identity confirmed: verbally with patient and hospital-assigned identification number Body area: ear Location details: left ear  Sedation: Patient sedated: no  Patient restrained: no Patient cooperative: yes Localization method: visualized Removal mechanism: alligator forceps Complexity: complex 1 objects recovered. Objects recovered: cue tip Post-procedure assessment: foreign body removed Patient tolerance: patient tolerated the procedure well with no immediate complications     Medications Ordered  in the ED  amoxicillin (AMOXIL) capsule 500 mg (has no administration in time range)                                    Medical Decision Making  43 year old well-appearing female presenting for foreign body in the left ear.  Exam notable for what appeared to be likely a Q-tip in the left ear canal.  Foreign body removal went well.  After removal, could visualize the left TM and there were findings suggestive of middle ear infection.  Also gently irrigated the ear canal with hydrogen peroxide.  Advised her to follow-up with her  PCP.  Started her on amoxicillin for middle ear infection.  Discharged condition.     Final diagnoses:  Foreign body of left ear, initial encounter  Left otitis media, unspecified otitis media type    ED Discharge Orders          Ordered    amoxicillin (AMOXIL) 500 MG capsule  3 times daily        07/13/23 2021               Coreyon Nicotra K, PA-C 07/13/23 2022    Ruthe Cornet, DO 07/13/23 2106

## 2023-07-15 ENCOUNTER — Telehealth: Payer: Self-pay

## 2023-07-15 NOTE — Transitions of Care (Post Inpatient/ED Visit) (Signed)
   07/15/2023  Name: Jaime Frost MRN: [id number] DOB: 1980-10-03  Today's TOC FU Call Status:    Attempted to reach the patient regarding the most recent Inpatient/ED visit.  Follow Up Plan: Additional outreach attempts will be made to reach the patient to complete the Transitions of Care (Post Inpatient/ED visit) call.   Signature:  Koleen Robson, CMA II
